# Patient Record
Sex: Female | Born: 1950 | Race: White | Hispanic: No | Marital: Married | Smoking: Never smoker
Health system: Southern US, Community
[De-identification: ages and names within clinical notes are randomized; demographics above are authoritative.]

## PROBLEM LIST (undated history)

## (undated) DIAGNOSIS — I10 Essential (primary) hypertension: Secondary | ICD-10-CM

## (undated) DIAGNOSIS — E0500 Thyrotoxicosis with diffuse goiter without thyrotoxic crisis or storm: Secondary | ICD-10-CM

## (undated) DIAGNOSIS — G43909 Migraine, unspecified, not intractable, without status migrainosus: Secondary | ICD-10-CM

## (undated) DIAGNOSIS — Z1211 Encounter for screening for malignant neoplasm of colon: Secondary | ICD-10-CM

## (undated) DIAGNOSIS — C801 Malignant (primary) neoplasm, unspecified: Secondary | ICD-10-CM

## (undated) DIAGNOSIS — Z923 Personal history of irradiation: Secondary | ICD-10-CM

## (undated) DIAGNOSIS — C50919 Malignant neoplasm of unspecified site of unspecified female breast: Secondary | ICD-10-CM

## (undated) HISTORY — DX: Thyrotoxicosis with diffuse goiter without thyrotoxic crisis or storm: E05.00

## (undated) HISTORY — DX: Essential (primary) hypertension: I10

## (undated) HISTORY — DX: Malignant (primary) neoplasm, unspecified: C80.1

## (undated) HISTORY — PX: FOOT SURGERY: SHX648

## (undated) HISTORY — DX: Migraine, unspecified, not intractable, without status migrainosus: G43.909

## (undated) HISTORY — DX: Encounter for screening for malignant neoplasm of colon: Z12.11

---

## 2000-08-04 ENCOUNTER — Emergency Department (HOSPITAL_COMMUNITY): Admission: EM | Admit: 2000-08-04 | Discharge: 2000-08-04 | Payer: Self-pay | Admitting: Internal Medicine

## 2001-09-05 DIAGNOSIS — C801 Malignant (primary) neoplasm, unspecified: Secondary | ICD-10-CM

## 2001-09-05 DIAGNOSIS — C50919 Malignant neoplasm of unspecified site of unspecified female breast: Secondary | ICD-10-CM

## 2001-09-05 HISTORY — DX: Malignant neoplasm of unspecified site of unspecified female breast: C50.919

## 2001-09-05 HISTORY — PX: BREAST SURGERY: SHX581

## 2001-09-05 HISTORY — DX: Malignant (primary) neoplasm, unspecified: C80.1

## 2001-09-05 HISTORY — PX: BREAST LUMPECTOMY: SHX2

## 2001-09-05 HISTORY — PX: BREAST EXCISIONAL BIOPSY: SUR124

## 2002-09-05 HISTORY — PX: COLONOSCOPY: SHX174

## 2002-09-05 HISTORY — PX: ABDOMINAL HYSTERECTOMY: SHX81

## 2002-12-11 ENCOUNTER — Other Ambulatory Visit: Admission: RE | Admit: 2002-12-11 | Discharge: 2002-12-11 | Payer: Self-pay | Admitting: Obstetrics and Gynecology

## 2003-04-16 ENCOUNTER — Observation Stay (HOSPITAL_COMMUNITY): Admission: RE | Admit: 2003-04-16 | Discharge: 2003-04-17 | Payer: Self-pay | Admitting: Obstetrics and Gynecology

## 2004-06-30 ENCOUNTER — Ambulatory Visit: Payer: Self-pay | Admitting: Unknown Physician Specialty

## 2004-07-01 ENCOUNTER — Ambulatory Visit: Payer: Self-pay | Admitting: Oncology

## 2004-07-02 ENCOUNTER — Ambulatory Visit: Payer: Self-pay | Admitting: Unknown Physician Specialty

## 2004-07-06 ENCOUNTER — Ambulatory Visit: Payer: Self-pay | Admitting: Oncology

## 2004-09-05 DIAGNOSIS — I10 Essential (primary) hypertension: Secondary | ICD-10-CM

## 2004-09-05 HISTORY — DX: Essential (primary) hypertension: I10

## 2004-09-13 ENCOUNTER — Ambulatory Visit: Payer: Self-pay | Admitting: General Surgery

## 2004-12-30 ENCOUNTER — Ambulatory Visit: Payer: Self-pay | Admitting: Oncology

## 2005-01-03 ENCOUNTER — Ambulatory Visit: Payer: Self-pay | Admitting: Oncology

## 2005-04-11 ENCOUNTER — Ambulatory Visit: Payer: Self-pay | Admitting: General Surgery

## 2005-11-16 ENCOUNTER — Ambulatory Visit: Payer: Self-pay | Admitting: Oncology

## 2005-12-04 ENCOUNTER — Ambulatory Visit: Payer: Self-pay | Admitting: Oncology

## 2006-04-10 ENCOUNTER — Ambulatory Visit: Payer: Self-pay | Admitting: General Surgery

## 2006-06-28 ENCOUNTER — Ambulatory Visit: Payer: Self-pay | Admitting: Oncology

## 2006-07-06 ENCOUNTER — Ambulatory Visit: Payer: Self-pay | Admitting: Oncology

## 2007-01-04 ENCOUNTER — Ambulatory Visit: Payer: Self-pay | Admitting: Oncology

## 2007-01-11 ENCOUNTER — Ambulatory Visit: Payer: Self-pay | Admitting: Oncology

## 2007-02-04 ENCOUNTER — Ambulatory Visit: Payer: Self-pay | Admitting: Oncology

## 2007-04-16 ENCOUNTER — Ambulatory Visit: Payer: Self-pay | Admitting: General Surgery

## 2007-08-06 ENCOUNTER — Ambulatory Visit: Payer: Self-pay | Admitting: Oncology

## 2007-08-10 ENCOUNTER — Ambulatory Visit: Payer: Self-pay | Admitting: Oncology

## 2007-09-06 ENCOUNTER — Ambulatory Visit: Payer: Self-pay | Admitting: Oncology

## 2007-09-06 HISTORY — PX: CYST EXCISION: SHX5701

## 2007-10-08 ENCOUNTER — Ambulatory Visit: Payer: Self-pay | Admitting: General Surgery

## 2008-02-04 ENCOUNTER — Ambulatory Visit: Payer: Self-pay | Admitting: Oncology

## 2008-02-12 ENCOUNTER — Ambulatory Visit: Payer: Self-pay | Admitting: Oncology

## 2008-03-05 ENCOUNTER — Ambulatory Visit: Payer: Self-pay | Admitting: Oncology

## 2008-04-05 ENCOUNTER — Ambulatory Visit: Payer: Self-pay | Admitting: Oncology

## 2008-04-07 ENCOUNTER — Ambulatory Visit: Payer: Self-pay | Admitting: General Surgery

## 2008-04-09 ENCOUNTER — Encounter: Payer: Self-pay | Admitting: Otolaryngology

## 2008-05-06 ENCOUNTER — Encounter: Payer: Self-pay | Admitting: Otolaryngology

## 2009-02-03 ENCOUNTER — Ambulatory Visit: Payer: Self-pay | Admitting: Oncology

## 2009-02-26 ENCOUNTER — Ambulatory Visit: Payer: Self-pay | Admitting: Oncology

## 2009-03-05 ENCOUNTER — Ambulatory Visit: Payer: Self-pay | Admitting: Oncology

## 2009-04-15 ENCOUNTER — Ambulatory Visit: Payer: Self-pay | Admitting: General Surgery

## 2010-03-05 ENCOUNTER — Ambulatory Visit: Payer: Self-pay | Admitting: Oncology

## 2010-03-12 ENCOUNTER — Ambulatory Visit: Payer: Self-pay | Admitting: Oncology

## 2010-04-05 ENCOUNTER — Ambulatory Visit: Payer: Self-pay | Admitting: Oncology

## 2010-05-06 ENCOUNTER — Ambulatory Visit: Payer: Self-pay | Admitting: Oncology

## 2011-03-29 ENCOUNTER — Ambulatory Visit: Payer: Self-pay | Admitting: Oncology

## 2011-04-06 ENCOUNTER — Ambulatory Visit: Payer: Self-pay | Admitting: Oncology

## 2011-05-07 ENCOUNTER — Ambulatory Visit: Payer: Self-pay | Admitting: Oncology

## 2011-09-06 HISTORY — PX: REPLACEMENT TOTAL KNEE: SUR1224

## 2011-10-24 ENCOUNTER — Ambulatory Visit: Payer: Self-pay | Admitting: General Practice

## 2011-10-24 LAB — BASIC METABOLIC PANEL
Anion Gap: 10 (ref 7–16)
Calcium, Total: 8.9 mg/dL (ref 8.5–10.1)
Chloride: 105 mmol/L (ref 98–107)
Co2: 29 mmol/L (ref 21–32)
EGFR (African American): >60
EGFR (Non-African Amer.): >60
Potassium: 3.8 mmol/L (ref 3.5–5.1)
Sodium: 144 mmol/L (ref 136–145)

## 2011-10-24 LAB — PROTIME-INR
INR: 0.9
Prothrombin Time: 12.4 secs (ref 11.5–14.7)

## 2011-10-24 LAB — URINALYSIS, COMPLETE
Bacteria: NONE SEEN
Bilirubin,UR: NEGATIVE
Glucose,UR: NEGATIVE mg/dL (ref 0–75)
Ketone: NEGATIVE
Ph: 6 (ref 4.5–8.0)
RBC,UR: 1 /HPF (ref 0–5)
Squamous Epithelial: 3
WBC UR: 1 /HPF (ref 0–5)

## 2011-10-24 LAB — CBC
MCHC: 32.8 g/dL (ref 32.0–36.0)
Platelet: 257 10*3/uL (ref 150–440)
RDW: 12.8 % (ref 11.5–14.5)
WBC: 6 10*3/uL (ref 3.6–11.0)

## 2011-10-24 LAB — SEDIMENTATION RATE: Erythrocyte Sed Rate: 6 mm/hr (ref 0–30)

## 2011-10-24 LAB — APTT: Activated PTT: 27 secs (ref 23.6–35.9)

## 2011-11-07 ENCOUNTER — Inpatient Hospital Stay: Payer: Self-pay | Admitting: General Practice

## 2011-11-08 LAB — BASIC METABOLIC PANEL
Calcium, Total: 8.1 mg/dL — ABNORMAL LOW (ref 8.5–10.1)
Co2: 24 mmol/L (ref 21–32)
Creatinine: 0.72 mg/dL (ref 0.60–1.30)
Potassium: 3.6 mmol/L (ref 3.5–5.1)
Sodium: 139 mmol/L (ref 136–145)

## 2011-11-08 LAB — HEMOGLOBIN: HGB: 10.9 g/dL — ABNORMAL LOW (ref 12.0–16.0)

## 2011-11-08 LAB — PLATELET COUNT: Platelet: 162 10*3/uL (ref 150–440)

## 2011-11-09 LAB — PLATELET COUNT: Platelet: 164 10*3/uL (ref 150–440)

## 2011-11-09 LAB — BASIC METABOLIC PANEL
Anion Gap: 12 (ref 7–16)
BUN: 8 mg/dL (ref 7–18)
Calcium, Total: 8.1 mg/dL — ABNORMAL LOW (ref 8.5–10.1)
Co2: 25 mmol/L (ref 21–32)
Creatinine: 0.7 mg/dL (ref 0.60–1.30)
EGFR (African American): >60
Osmolality: 288 (ref 275–301)

## 2011-11-09 LAB — HEMOGLOBIN: HGB: 10.9 g/dL — ABNORMAL LOW (ref 12.0–16.0)

## 2012-03-28 ENCOUNTER — Ambulatory Visit: Payer: Self-pay | Admitting: Oncology

## 2012-04-10 ENCOUNTER — Ambulatory Visit: Payer: Self-pay | Admitting: Oncology

## 2012-04-10 LAB — COMPREHENSIVE METABOLIC PANEL
Albumin: 3.9 g/dL (ref 3.4–5.0)
Alkaline Phosphatase: 77 U/L (ref 50–136)
Bilirubin,Total: 0.8 mg/dL (ref 0.2–1.0)
Creatinine: 0.99 mg/dL (ref 0.60–1.30)
Glucose: 94 mg/dL (ref 65–99)
Osmolality: 278 (ref 275–301)
SGPT (ALT): 27 U/L (ref 12–78)
Sodium: 138 mmol/L (ref 136–145)

## 2012-04-10 LAB — CBC CANCER CENTER
Basophil #: 0 x10 3/mm (ref 0.0–0.1)
Basophil %: 0.7 %
Lymphocyte %: 39.4 %
MCV: 93 fL (ref 80–100)
Monocyte %: 5.9 %
Neutrophil #: 3.1 x10 3/mm (ref 1.4–6.5)
Platelet: 227 x10 3/mm (ref 150–440)
RDW: 14.7 % — ABNORMAL HIGH (ref 11.5–14.5)
WBC: 6.1 x10 3/mm (ref 3.6–11.0)

## 2012-04-11 LAB — CANCER ANTIGEN 27.29: CA 27.29: 18.6 U/mL (ref 0.0–38.6)

## 2012-05-06 ENCOUNTER — Ambulatory Visit: Payer: Self-pay | Admitting: Oncology

## 2013-04-15 ENCOUNTER — Other Ambulatory Visit: Payer: Self-pay | Admitting: *Deleted

## 2013-04-15 ENCOUNTER — Ambulatory Visit (INDEPENDENT_AMBULATORY_CARE_PROVIDER_SITE_OTHER): Payer: BC Managed Care – PPO | Admitting: General Surgery

## 2013-04-15 ENCOUNTER — Encounter: Payer: Self-pay | Admitting: General Surgery

## 2013-04-15 VITALS — BP 134/82 | HR 62 | Resp 14 | Ht 63.0 in | Wt 160.0 lb

## 2013-04-15 DIAGNOSIS — Z1211 Encounter for screening for malignant neoplasm of colon: Secondary | ICD-10-CM

## 2013-04-15 DIAGNOSIS — Z853 Personal history of malignant neoplasm of breast: Secondary | ICD-10-CM

## 2013-04-15 HISTORY — DX: Encounter for screening for malignant neoplasm of colon: Z12.11

## 2013-04-15 MED ORDER — POLYETHYLENE GLYCOL 3350 17 GM/SCOOP PO POWD: ORAL | Status: DC

## 2013-04-15 NOTE — Progress Notes (Signed)
Patient to have a bilateral diagnostic mammogram at [facility] on 04-16-13 at 9 am. She is aware of date, time, and instructions.

## 2013-04-15 NOTE — Progress Notes (Signed)
Patient ID: Diane Shea, female   DOB: 07-22-1951, 62 y.o.   MRN: [id number]  Chief Complaint  Patient presents with  . Other    colonoscopy    HPI Diane Shea is a 62 y.o. female here today for an evalation of an colonoscopy screening.No GI problems.   HPI  Past Medical History  Diagnosis Date  . Cancer 2003    Breast  . Graves disease   . Migraines   . Hypertension 2006  . Encounter for screening colonoscopy 04/15/2013    Past Surgical History  Procedure Laterality Date  . 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 Breast lumpectomy Left 2003  . Breast biopsy Left 2003    sentinel node  . Breast surgery Left 2003    mammosite placement  . Colonoscopy  2004  . Abdominal hysterectomy  2004  . Foot surgery    . Replacement total knee Left 2013    No family history on file.  Social History History  Substance Use Topics  . Smoking status: Never Smoker   . Smokeless tobacco: Never Used  . Alcohol Use: No    Not on File  Current Outpatient Prescriptions  Medication Sig Dispense Refill  . aspirin 81 MG tablet Take 81 mg by mouth daily.      . calcium carbonate (OS-CAL) 600 MG TABS tablet Take 600 mg by mouth 2 (two) times daily with a meal.      . cholecalciferol (VITAMIN D) 1000 UNITS tablet Take 1,000 Units by mouth daily.      . fish oil-omega-3 fatty acids 1000 MG capsule Take 2 g by mouth daily.      Marland Kitchen levothyroxine (SYNTHROID, LEVOTHROID) 137 MCG tablet Take 1 tablet by mouth daily.      . Multiple Vitamins-Minerals (MULTIVITAMIN WITH MINERALS) tablet Take 1 tablet by mouth daily.      . propranolol ER (INDERAL LA) 60 MG 24 hr capsule Take 1 capsule by mouth daily.      . SUMAtriptan (IMITREX) 100 MG tablet Take 1 tablet by mouth daily.      . polyethylene glycol powder (GLYCOLAX/MIRALAX) powder 255 grams one bottle for colonoscopy prep  255 g  0   No current facility-administered medications for this visit.    Review of Systems Review of Systems   Constitutional: Negative.   Respiratory: Negative.   Cardiovascular: Negative.   Gastrointestinal: Negative.     Blood pressure 134/82, pulse 62, resp. rate 14, height 5\' 3"  (1.6 m), weight 160 lb (72.576 kg).  Physical Exam Physical Exam  Constitutional: She is oriented to person, place, and time. She appears well-developed and well-nourished.  Cardiovascular: Normal rate, regular rhythm and normal heart sounds.   Pulmonary/Chest: Breath sounds normal. Right breast exhibits no inverted nipple, no mass, no nipple discharge, no skin change and no tenderness. Left breast exhibits no inverted nipple, no mass, no nipple discharge, no skin change and no tenderness.    Left breast  3 cm 12 o'clock thickening  Lymphadenopathy:    She has no cervical adenopathy.    She has no axillary adenopathy.  Neurological: She is alert and oriented to person, place, and time.  Skin: Skin is warm and dry.    Data Reviewed  The patient's 2004 examination was notable for one 5 mm polyp in the transverse colon.   Assessment    Candidate for followup colonoscopy.     Plan    The patient has  undergone total joint replacement since her previous study. We'll contact her to confirm no medical allergies. If not, she will be given amoxicillin 2 g p.o. One hour prior to procedure to minimize the risk of joint space infection.     Patient to have a bilateral diagnostic mammogram at [facility] on 04-16-13 at 9 am.  This patient has been scheduled for a colonoscopy on 05-28-13 at [facility]. Patient has been asked to discontinue fish oil one week prior to procedure.   Diane Shea 04/15/2013, 8:46 PM

## 2013-04-15 NOTE — Patient Instructions (Addendum)
Colonoscopy A colonoscopy is an exam to evaluate your entire colon. In this exam, your colon is cleansed. A long fiberoptic tube is inserted through your rectum and into your colon. The fiberoptic scope (endoscope) is a long bundle of enclosed and very flexible fibers. These fibers transmit light to the area examined and send images from that area to your caregiver. Discomfort is usually minimal. You may be given a drug to help you sleep (sedative) during or prior to the procedure. This exam helps to detect lumps (tumors), polyps, inflammation, and areas of bleeding. Your caregiver may also take a small piece of tissue (biopsy) that will be examined under a microscope. LET YOUR CAREGIVER KNOW ABOUT:   Allergies to food or medicine.  Medicines taken, including vitamins, herbs, eyedrops, over-the-counter medicines, and creams.  Use of steroids (by mouth or creams).  Previous problems with anesthetics or numbing medicines.  History of bleeding problems or blood clots.  Previous surgery.  Other health problems, including diabetes and kidney problems.  Possibility of pregnancy, if this applies. BEFORE THE PROCEDURE   A clear liquid diet may be required for 2 days before the exam.  Ask your caregiver about changing or stopping your regular medications.  Liquid injections (enemas) or laxatives may be required.  A large amount of electrolyte solution may be given to you to drink over a short period of time. This solution is used to clean out your colon.  You should be present 60 minutes prior to your procedure or as directed by your caregiver. AFTER THE PROCEDURE   If you received a sedative or pain relieving medication, you will need to arrange for someone to drive you home.  Occasionally, there is a little blood passed with the first bowel movement. Do not be concerned. FINDING OUT THE RESULTS OF YOUR TEST Not all test results are available during your visit. If your test results are  not back during the visit, make an appointment with your caregiver to find out the results. Do not assume everything is normal if you have not heard from your caregiver or the medical facility. It is important for you to follow up on all of your test results. HOME CARE INSTRUCTIONS   It is not unusual to pass moderate amounts of gas and experience mild abdominal cramping following the procedure. This is due to air being used to inflate your colon during the exam. Walking or a warm pack on your belly (abdomen) may help.  You may resume all normal meals and activities after sedatives and medicines have worn off.  Only take over-the-counter or prescription medicines for pain, discomfort, or fever as directed by your caregiver. Do not use aspirin or blood thinners if a biopsy was taken. Consult your caregiver for medicine usage if biopsies were taken. SEEK IMMEDIATE MEDICAL CARE IF:   You have a fever.  You pass large blood clots or fill a toilet with blood following the procedure. This may also occur 10 to 14 days following the procedure. This is more likely if a biopsy was taken.  You develop abdominal pain that keeps getting worse and cannot be relieved with medicine. Document Released: 08/19/2000 Document Revised: 11/14/2011 Document Reviewed: 04/03/2008 [facility] Patient Information 2014 [place], [place].  Patient to have a bilateral diagnostic mammogram at [facility] on 04-16-13 at 9 am.  This patient has been scheduled for a colonoscopy on 05-28-13 at [facility]. Patient has been asked to discontinue fish oil one week prior to procedure.

## 2013-04-16 ENCOUNTER — Ambulatory Visit: Payer: Self-pay | Admitting: General Surgery

## 2013-04-16 ENCOUNTER — Telehealth: Payer: Self-pay | Admitting: *Deleted

## 2013-04-16 NOTE — Telephone Encounter (Signed)
Message copied by Levada Schilling on Tue Apr 16, 2013  9:13 AM ------      Message from: [place], [place] W      Created: Mon Apr 15, 2013  8:50 PM       Please contact the patient and clarify whether she has any medical allergies. Thank you ------

## 2013-05-19 ENCOUNTER — Other Ambulatory Visit: Payer: Self-pay | Admitting: General Surgery

## 2013-05-19 DIAGNOSIS — Z853 Personal history of malignant neoplasm of breast: Secondary | ICD-10-CM

## 2013-05-20 ENCOUNTER — Telehealth: Payer: Self-pay | Admitting: *Deleted

## 2013-05-20 NOTE — Telephone Encounter (Signed)
Patient reports no medication changes since last office visit. She states she has already pre-registered and has Miralax prescription. We will proceed with colonoscopy that is scheduled for 05-28-13 at [facility].

## 2013-05-20 NOTE — Telephone Encounter (Signed)
Called the patient she states she is not  allergy to any medication or does not have any allergies . No new medication changes. I called in Amoxicillin  500 mg #4 to her pharmacy.

## 2013-05-20 NOTE — Telephone Encounter (Signed)
Message copied by Levada Schilling on Mon May 20, 2013  9:36 AM ------      Message from: [place], [place] W      Created: Sun May 19, 2013 12:20 PM       In August there was a request to confirm whether the patient had any allergies. I do not see that this was completed. Please confirm whether the patient does or does not have any allergies. If no allergies, a prescription for amoxicillin, 500 mg capsules, #4 to be taken one hour prior to colonoscopy should be sent for pharmacy. If she does have a penicillin allergy I need to be notified. ------

## 2013-05-28 ENCOUNTER — Ambulatory Visit: Payer: Self-pay | Admitting: General Surgery

## 2013-05-28 DIAGNOSIS — Z1211 Encounter for screening for malignant neoplasm of colon: Secondary | ICD-10-CM

## 2013-05-29 ENCOUNTER — Encounter: Payer: Self-pay | Admitting: General Surgery

## 2014-06-17 ENCOUNTER — Ambulatory Visit: Payer: Self-pay | Admitting: Internal Medicine

## 2014-07-07 ENCOUNTER — Encounter: Payer: Self-pay | Admitting: General Surgery

## 2014-09-09 ENCOUNTER — Ambulatory Visit: Payer: Self-pay | Admitting: General Practice

## 2014-09-09 DIAGNOSIS — I10 Essential (primary) hypertension: Secondary | ICD-10-CM

## 2014-09-09 LAB — BASIC METABOLIC PANEL
Anion Gap: 7 (ref 7–16)
BUN: 12 mg/dL (ref 7–18)
CALCIUM: 9 mg/dL (ref 8.5–10.1)
CREATININE: 0.69 mg/dL (ref 0.60–1.30)
Chloride: 104 mmol/L (ref 98–107)
Co2: 28 mmol/L (ref 21–32)
EGFR (African American): >60
EGFR (Non-African Amer.): >60
Glucose: 77 mg/dL (ref 65–99)
Osmolality: 276 (ref 275–301)
POTASSIUM: 4 mmol/L (ref 3.5–5.1)
SODIUM: 139 mmol/L (ref 136–145)

## 2014-09-09 LAB — SEDIMENTATION RATE: ERYTHROCYTE SED RATE: 6 mm/h (ref 0–30)

## 2014-09-09 LAB — URINALYSIS, COMPLETE
BACTERIA: NONE SEEN
Bilirubin,UR: NEGATIVE
Blood: NEGATIVE
GLUCOSE, UR: NEGATIVE mg/dL (ref 0–75)
Ketone: NEGATIVE
LEUKOCYTE ESTERASE: NEGATIVE
NITRITE: NEGATIVE
Ph: 6 (ref 4.5–8.0)
Protein: NEGATIVE
Specific Gravity: 1.014 (ref 1.003–1.030)
Squamous Epithelial: <1

## 2014-09-09 LAB — APTT: ACTIVATED PTT: 28.6 s (ref 23.6–35.9)

## 2014-09-09 LAB — CBC
HCT: 43.8 % (ref 35.0–47.0)
HGB: 14 g/dL (ref 12.0–16.0)
MCH: 31.3 pg (ref 26.0–34.0)
MCHC: 31.9 g/dL — AB (ref 32.0–36.0)
MCV: 98 fL (ref 80–100)
Platelet: 271 10*3/uL (ref 150–440)
RBC: 4.46 10*6/uL (ref 3.80–5.20)
RDW: 14.9 % — ABNORMAL HIGH (ref 11.5–14.5)
WBC: 4.9 10*3/uL (ref 3.6–11.0)

## 2014-09-09 LAB — PROTIME-INR
INR: 0.9
Prothrombin Time: 12.5 secs (ref 11.5–14.7)

## 2014-09-09 LAB — MRSA PCR SCREENING

## 2014-09-11 LAB — URINE CULTURE

## 2014-09-22 ENCOUNTER — Inpatient Hospital Stay: Payer: Self-pay | Admitting: General Practice

## 2014-09-23 LAB — BASIC METABOLIC PANEL
ANION GAP: 7 (ref 7–16)
BUN: 7 mg/dL (ref 7–18)
CHLORIDE: 108 mmol/L — AB (ref 98–107)
CO2: 26 mmol/L (ref 21–32)
CREATININE: 0.69 mg/dL (ref 0.60–1.30)
Calcium, Total: 7.8 mg/dL — ABNORMAL LOW (ref 8.5–10.1)
EGFR (African American): >60
EGFR (Non-African Amer.): >60
Glucose: 98 mg/dL (ref 65–99)
Osmolality: 279 (ref 275–301)
Potassium: 4 mmol/L (ref 3.5–5.1)
SODIUM: 141 mmol/L (ref 136–145)

## 2014-09-23 LAB — HEMOGLOBIN: HGB: 11.9 g/dL — AB (ref 12.0–16.0)

## 2014-09-23 LAB — PLATELET COUNT: Platelet: 191 10*3/uL (ref 150–440)

## 2014-09-24 LAB — BASIC METABOLIC PANEL
Anion Gap: 6 — ABNORMAL LOW (ref 7–16)
BUN: 8 mg/dL (ref 7–18)
CREATININE: 0.73 mg/dL (ref 0.60–1.30)
Calcium, Total: 7.9 mg/dL — ABNORMAL LOW (ref 8.5–10.1)
Chloride: 103 mmol/L (ref 98–107)
Co2: 27 mmol/L (ref 21–32)
EGFR (Non-African Amer.): >60
Glucose: 114 mg/dL — ABNORMAL HIGH (ref 65–99)
Osmolality: 271 (ref 275–301)
Potassium: 3.6 mmol/L (ref 3.5–5.1)
Sodium: 136 mmol/L (ref 136–145)

## 2014-09-24 LAB — PLATELET COUNT: PLATELETS: 172 10*3/uL (ref 150–440)

## 2014-09-24 LAB — HEMOGLOBIN: HGB: 11.6 g/dL — ABNORMAL LOW (ref 12.0–16.0)

## 2014-12-28 NOTE — Discharge Summary (Signed)
PATIENT NAME:  Diane Shea, APEL MR#:  [id number] DATE OF BIRTH:  [birth year]  DATE OF ADMISSION:  11/07/2011 DATE OF DISCHARGE:  11/10/2011   ADMITTING DIAGNOSIS: Degenerative arthrosis of left knee.   DISCHARGE DIAGNOSIS: Degenerative arthrosis of left knee.   HISTORY: The patient is a pleasant 64 year old female who has been followed at [facility] for progression of bilateral knee pain with the left being more symptomatic than the right. She has continued to report severe medial left knee pain with weightbearing activities. The patient on occasion had noted some swelling of the knee but denied any gross locking or giving way of the knee. She had also noted some difficulty with getting up and down. At the time of surgery, she was using a cane occasionally for ambulation. The patient had not seen any significant improvement in her condition despite a series of Synvisc injections and the use of anti-inflammatory medication. Her pain had progressed to the point that it was significantly interfering with her activities of daily living. X-rays taken in the clinic showed narrowing of the medial cartilage space with associated varus alignment. Osteophyte as well as subchondral sclerosis was noted. After discussion of the risks and benefits of surgical intervention, the patient expressed her understanding of the risks and benefits and agreed for plans for surgical intervention.   PROCEDURE: Left total knee arthroplasty using computer-assisted navigation.   ANESTHESIA: Femoral nerve block, spinal, and general.   SOFT TISSUE RELEASE: Anterior cruciate ligament, posterior cruciate ligament, deep medial collateral ligaments as well as the patellofemoral ligament.   IMPLANTS UTILIZED: DePuy PFC Sigma size 4 narrow posterior stabilized femoral component (cemented), size 3 MBT tibial component (cemented), 35 mm three pegged oval dome patella (cemented), and a 10 mm stabilized rotating platform polyethylene  insert.   HOSPITAL COURSE: The patient tolerated the procedure very well. She had no complications. She was then taken to PAC-U where she was stabilized and then transferred to the orthopedic floor. The patient began receiving anticoagulation therapy of Lovenox 30 mg sub-Q q.12 hours per anesthesia and pharmacy protocol. The patient was fitted with TED stockings bilaterally. These were allowed to be removed one hour per eight hour shift. The left one was applied on day two following removal of the Hemovac and dressing change. The patient was also fitted with the AV-I compression foot pumps set at 80 mmHg. Her calves have been nontender, free of any evidence of any deep venous thromboses. Negative Homans sign. Heels were elevated off the bed using rolled towels. There was no tissue breakdown. No tenderness noted to palpation.   The patient has denied any chest pain or any shortness of breath. Vital signs have been stable. She has been afebrile. Hemodynamically the patient was stable. No transfusions were given other than the Autovac transfusions for six hours postoperatively.   The patient began receiving physical therapy on day one. Upon being discharged, she was ambulating greater than 215 feet. She was able go up and down four sets of steps. She was independent with bed-to-chair transfers. Occupational therapy was also initiated on day one for activities of daily living and assistive devices.   The patient's Hemovac, IV, and Foley were all discontinued on day two along with a dressing change. The wound was free of any drainage or signs of infection. Polar Care was reapplied to the surgical leg maintaining a temperature of 40 to 50 degrees Fahrenheit.   DISPOSITION: The patient is being discharged to home in improved stable condition.  DISCHARGE  INSTRUCTIONS:  1. She may weight bear as tolerated. Continue using a walker until cleared by physical therapy to go to a quad cane. She will receive home  health PT. 2. Continue TED stockings. These are to be worn bilaterally. She is to wear these during the day but may remove them at night.  3. Continue Polar Care maintaining a temperature of 40 to 50 degrees Fahrenheit.  4. She has a follow-up appointment in two weeks in the clinic, sooner if any temperatures of 101.5 or greater or excessive bleeding.  5. She is placed on a regular diet.  6. She is to resume her regular medication that she was on prior to admission. She was given a prescription for oxycodone 1 to 2 tablets q.4 to 6 hours p.r.n. for pain as well as tramadol 1 to 2 tablets q.4 to 6 hours p.r.n. for pain. A prescription was given for Lovenox 40 mg sub-Q daily for 14 days, then discontinue and begin taking one 81 mg enteric-coated aspirin per day.   PAST MEDICAL HISTORY:  1. Chickenpox. 2. Breast cancer in 2003. 3. Graves' disease in 2004.  4. Migraines. 5. Borderline hypertension.   ____________________________ Vance Peper, PA [id number] D: 11/10/2011 07:47:44 ET T: 11/10/2011 11:22:45 ET JOB#: [id number]  cc: Vance Peper, PA, <Dictator> Sheritta Deeg PA ELECTRONICALLY SIGNED 11/13/2011 19:57

## 2014-12-28 NOTE — Op Note (Signed)
PATIENT NAME:  Diane Shea, Diane Shea MR#:  [id number] DATE OF BIRTH:  11-Feb-1951  DATE OF PROCEDURE:  11/07/2011  PREOPERATIVE DIAGNOSIS: Degenerative arthrosis left knee.   POSTOPERATIVE DIAGNOSIS: Degenerative arthrosis left knee.   PROCEDURE PERFORMED: Left total knee arthroplasty using computer-assisted navigation.   SURGEON: Laurice Record. Holley Bouche., MD  ASSISTANT: Vance Peper, PA-C (required to maintain retraction throughout the procedure)   ANESTHESIA: Femoral nerve block, spinal, and general.   ESTIMATED BLOOD LOSS: 100 mL.   FLUIDS REPLACED: 1650 mL.   TOURNIQUET TIME: 83 minutes.   DRAINS: Two medium drains to reinfusion system.   SOFT TISSUE RELEASES: Anterior cruciate ligament, posterior cruciate ligament, deep medial collateral ligament, patellofemoral ligament.   IMPLANTS UTILIZED: DePuy PFC Sigma size 4N (narrow) posterior stabilized femoral component (cemented), size 3 MBT tibial component (cemented), 35 mm three peg oval dome patella (cemented), and a 10 mm stabilized rotating platform polyethylene insert.   INDICATIONS FOR SURGERY: The patient is a 64 year old female who has been seen for complaints of progressive left knee pain. X-rays demonstrated severe degenerative changes with relative varus deformity. She did not see any significant improvement despite conservative nonsurgical intervention. After discussion of the risks and benefits of surgical intervention, the patient expressed her understanding of the risks and benefits and agreed with plans for surgical intervention.   PROCEDURE IN DETAIL: Patient was brought into the Operating Room and, after adequate femoral nerve block, spinal, and general anesthesia was achieved, a tourniquet was placed on the patient's upper left thigh. Patient's left knee and leg were cleaned and prepped with alcohol and DuraPrep, draped in the usual sterile fashion. A "timeout" was performed as per usual protocol. The left lower extremity was  exsanguinated using an Esmarch, and the tourniquet was inflated to 300 mmHg. Anterior longitudinal incision was made followed by a standard mid vastus approach. A moderate effusion was evacuated. Deep fibers of the medial collateral ligament were elevated in subperiosteal fashion off of the medial flare of the tibia so as to maintain a continuous soft tissue sleeve. Patella was subluxed laterally and the patellofemoral ligament was incised. Inspection of the knee demonstrated severe degenerative changes in tricompartmental fashion with evidence of eburnated bone to the medial compartment. Prominent osteophytes were debrided using rongeur. Anterior and posterior cruciate ligaments were excised. Two 4.0 mm Schanz pins were inserted into the femur and into the tibia for attachment of the array of spheres used for computer-assisted navigation. Hip center was identified using circumduction technique. Distal landmarks were mapped using computer. Distal femur and proximal tibia were mapped using computer. Distal femoral cutting guide was positioned using computer-assisted navigation so as to achieve 5 degrees distal valgus cut. Cut was performed and verified using computer. Distal femur was sized and it was felt that a size 4N (narrow) femoral component was appropriate. A size 4 cutting guide was positioned using computer-assisted navigation and the anterior cut was performed and verified using computer followed by completion of the posterior and chamfer cuts. Femoral cutting guide for central box was then positioned and central box cut was performed.   Attention was then directed to the proximal tibia. The medial and lateral menisci were excised. The extramedullary tibial cutting guide was positioned using computer-assisted navigation so as to achieve 0 degrees varus valgus alignment and 0 degrees posterior slope. Cut was performed and verified using the computer. Proximal tibia was then sized and it was felt that a  size 3 tibial tray was appropriate. Tibial and femoral trials were  inserted followed by insertion of a 10 mm polyethylene trial. Excellent mediolateral soft tissue balancing was appreciated both in full extension and in 90 degrees of flexion. Finally, patella was cut and prepared so as to accommodate a 35 mm three peg oval dome patella. Patellar trial was placed and the knee was placed through a range of motion with excellent patellar tracking appreciated.   Femoral trial was removed. Central post hole for the tibial tray was reamed followed by insertion of a keel punch. Tibial trials were then removed. Cut surfaces of bone were irrigated with copious amounts of normal saline with antibiotic solution using pulsatile lavage and then suctioned dry. Polymethyl methacrylate cement was prepared using a vacuum mixer. Cement was applied to the cut surface of proximal tibia as well as along the undersurface of a size 3 MBT tibial component. Tibial component was positioned and impacted into place. Excess cement was removed using freer elevators. Cement was then applied to cut surface of the femur as well as along the posterior flanges of size 4N (narrow) posterior stabilized femoral component. Femoral component was positioned and impacted in place. Excess cement was removed using freer elevators. A 10 mm polyethylene trial was inserted and the knee was brought into full extension with steady axial compression applied. Finally, cement was applied to the backside of a 35 mm three peg oval dome patella and patellar component was positioned and patellar clamp applied. Excess cement was removed using freer elevators.   After adequate curing of cement, tourniquet was deflated after total tourniquet time of 83 minutes. Hemostasis was achieved using electrocautery. The knee was irrigated with copious amounts of normal saline with antibiotic solution using pulsatile lavage and then suctioned dry. The knee was inspected for any  residual cement debris. 30 mL of 0.25% Marcaine with epinephrine was injected along the posterior capsule. A 10 mm stabilized rotating platform polyethylene insert was inserted and the knee was placed through a range of motion. Excellent patellar tracking was appreciated and excellent mediolateral soft tissue balancing was appreciated both in full extension and in 90 degrees of flexion.   Two medium drains were placed in the wound bed and brought out through a separate stab incision to be attached to reinfusion system. Medial parapatellar portion of the incision was reapproximated using interrupted sutures of #1 Vicryl. Subcutaneous tissue was approximated in layers using first #0 Vicryl followed by 2-0 Vicryl. Skin was closed with skin staples. Sterile dressing was applied.   Patient tolerated procedure well. She was transported to the recovery room in stable condition.    ____________________________ Laurice Record. Holley Bouche., MD jph:cms D: 11/07/2011 18:12:01 ET T: 11/08/2011 09:22:00 ET JOB#: [id number]  cc: Jeneen Rinks P. Holley Bouche., MD, <Dictator>  JAMES P Holley Bouche MD ELECTRONICALLY SIGNED 11/13/2011 20:39

## 2015-01-04 NOTE — Op Note (Signed)
PATIENT NAME:  Diane Shea, Diane Shea MR#:  [id number] DATE OF BIRTH:  May 02, 1951  DATE OF PROCEDURE:  09/22/2014  PREOPERATIVE DIAGNOSIS:  Degenerative arthrosis of the right knee (primary).   POSTOPERATIVE DIAGNOSIS:  Degenerative arthrosis of the right knee (primary).   PROCEDURE PERFORMED:  Right total knee arthroplasty using computer-assisted navigation.   SURGEON:  Laurice Record. Holley Bouche., MD   ASSISTANT:  Vance Peper, PA (required to maintain retraction throughout the procedure)   ANESTHESIA:  Spinal.   ESTIMATED BLOOD LOSS:  50 mL.   FLUIDS REPLACED:  1800 mL of crystalloid.   TOURNIQUET TIME:  82 minutes.   DRAINS:  Two medium drains to reinfusion system.   SOFT TISSUE RELEASES:  Anterior cruciate ligament, posterior cruciate ligament, deep medial collateral ligament, and patellofemoral ligament.   IMPLANTS UTILIZED:  DePuy PFC Sigma size 4N (narrow) posterior stabilized femoral component (cemented), a size 3 MBT tibial component (cemented), a 35 mm 3-peg oval dome patella (cemented), and a 10 mm stabilized rotating platform polyethylene insert.   INDICATIONS FOR SURGERY:  The patient is a 64 year old female who has been seen for complaints of progressive right knee pain. X-rays demonstrated severe degenerative changes in tricompartmental fashion with relative varus deformity. After discussion of the risks and benefits of surgical intervention, the patient expressed understanding of the risks and benefits and agreed with plans for surgical intervention.   PROCEDURE IN DETAIL:  The patient was brought in to the operating room, and after adequate spinal anesthesia was achieved, a tourniquet was placed on the patient's upper right thigh. The patient's right knee and leg were cleaned and prepped with alcohol and DuraPrep and draped in the usual sterile fashion. A "timeout" was performed as per usual protocol. The right lower extremity was exsanguinated using an Esmarch, and the tourniquet was  inflated to 300 mmHg. An anterior longitudinal incision was made followed by a standard mid vastus approach. A moderate effusion was evacuated. The deep fibers of the medial collateral ligament were elevated in a subperiosteal fashion off the medial flare of the tibia so as to maintain a continuous soft tissue sleeve. The patella was subluxed laterally, and the patellofemoral ligament was incised. Inspection of the knee demonstrated severe degenerative changes with full-thickness loss of articular cartilage most notably to the medial compartment. Prominent osteophytes were debrided using a rongeur. Anterior and posterior cruciate ligaments were excised. Two 4.0 mm Schanz pins were inserted into the femur and into the tibia for attachment of the ray of trackers used for computer-assisted navigation. Hip center was identified using circumduction technique. Distal landmarks were mapped using the computer. The distal femur and proximal tibia were mapped using the computer. Distal femoral cutting guide was positioned using computer-assisted navigation so as to achieve a 5-degree distal valgus cut. Cut was performed and verified using the computer. The distal femur was sized, and it was felt that a size 4N (narrow) femoral component was appropriate. The size 4 cutting guide was positioned, and anterior cut was performed and verified using the computer. This was followed by completion of the posterior and chamfer cuts. Femoral cutting guide for the central box was then positioned, and a central box cut was performed. Attention was then directed to the proximal tibia.   Medial and lateral menisci were excised. The extramedullary tibial cutting guide was positioned using computer-assisted navigation so as to achieve a 0-degree varus-valgus alignment and 0-degree posterior slope. Cut was performed and verified using the computer. The proximal tibia was sized, and  it was felt that a size 3 tibial tray was appropriate. Tibial  and femoral trials were inserted followed by insertion of a 10 mm polyethylene insert. The knee lacked full extension, and it was elected to resect an additional 2 mm of bone from the proximal tibia. The extramedullary tibial cutting guide was repositioned so as to remove an additional 2 mm of bone. This was performed and verified using the computer. Tibial and femoral trials were placed followed by insertion of a 10 mm polyethylene trial. This allowed for excellent mediolateral soft tissue balancing both in full extension and in flexion. Finally, the patella was cut and repaired so as to accommodate a 35 mm 3-peg oval dome patella. Patellar trial was placed, and the knee was placed through a range of motion with excellent patellar tracking appreciated. The femoral trial was removed. Central post hole for the tibial component was reamed followed by insertion of a keel punch. Tibial trials were then removed. The cut surfaces of bone were irrigated with copious amounts of normal saline with antibiotic solution using pulsatile lavage and then suctioned dry. Polymethyl methacrylate cement was prepared in the usual fashion using a vacuum mixer. Cement was applied to the cut surface of the proximal tibia as well as along the undersurface of a size 3 MBT tibial component. The tibial component was positioned and impacted into place. Excess cement was removed using [employer] fast streamer. Cement was then applied to the cut surface of the femur as well as on the posterior flanges of a size 4N (narrow) posterior stabilized femoral component. Femoral component was positioned and impacted into place. Excess cement was removed using [employer] fast streamer. A 10 mm polyethylene trial was inserted, and the knee was brought in full extension with steady axial compression applied. Finally, cement was applied to the backside of a 35 mm 3-peg oval dome patella, and the patellar component was positioned and patellar clamp applied. Excess cement was  removed using [employer] fast streamer.   After adequate curing of cement, the tourniquet was deflated after a total tourniquet time of 82 minutes. Hemostasis was achieved using electrocautery. The knee was irrigated with copious amounts of normal saline with antibiotic solution using pulsatile lavage and then suctioned dry. The knee was inspected for any residual cement debris. Exparel 1.3% 20 mL and 40 mL of normal saline was injected along the posterior capsule, medial and lateral gutters, and along the arthrotomy site. A 10 mm stabilized rotating platform polyethylene insert was inserted, and the knee was placed through a range of motion with excellent mediolateral soft tissue balancing appreciated and excellent patellar tracking appreciated. Two medium drains were placed and then brought out through a separate stab incision to be attached to a reinfusion system. The medial parapatellar portion of the incision was reapproximated using interrupted sutures of #1 Vicryl. The subcutaneous tissue was injected with 30 mL of 0.25% Marcaine with epinephrine. The subcutaneous tissue was then reapproximated in layers using first #0 Vicryl followed by #2-0 Vicryl. The skin was closed with skin staples. A sterile dressing was applied.   The patient tolerated the procedure well. She was transported to the recovery room in stable condition.   ____________________________ Laurice Record. Holley Bouche., MD jph:nb D: 09/22/2014 22:38:32 ET T: 09/22/2014 23:07:59 ET JOB#: [id number]  cc: Laurice Record. Holley Bouche., MD, <Dictator> JAMES P Holley Bouche MD ELECTRONICALLY SIGNED 09/28/2014 21:28

## 2015-01-04 NOTE — Discharge Summary (Signed)
PATIENT NAME:  Diane, Shea MR#:  [id number] DATE OF BIRTH:  1951/08/16  DATE OF ADMISSION:  09/22/2014 DATE OF DISCHARGE:  09/25/2014   ADMITTING DIAGNOSIS: Degenerative arthrosis of the right knee.   DISCHARGE DIAGNOSIS: Degenerative arthrosis of the right knee.   HISTORY OF PRESENT ILLNESS: The patient is a 64 year old who has been followed at [facility] for progression of right knee pain. The patient had previously undergone a left total knee arthroplasty and had done well. She states that the right knee had increase in discomfort. States that last cortisone injection she was given did help significantly; however, she continues to have problems with standing for long periods of time or walking long periods. She had localized most of the pain along the medial aspect of the knee. Her pain was aggravated with weight-bearing activities. She had noticed a progressive decrease in her range of motion of the knee. At the time of surgery, she was not using any ambulatory aid. She had reported the start of stiffness, as well as difficulty from arising from a sitting position. She had not seen any significant improvement in her condition despite weight reduction, activity modification, and intra-articular cortisone injection. The right knee pain had progressed to the point that it was significantly interfering with her activities of daily living. X-rays taken in [facility] showed narrowing of the medial cartilage space with associated varus alignment, osteophyte, as well as subchondral sclerosis were noted. After discussion of the risks and benefits of surgical intervention, the patient expressed her understanding of the risks and benefits and agreed for plans for surgical intervention.   HOSPITAL COURSE:   PROCEDURE: Right total knee arthroplasty using computer-assisted navigation.   ANESTHESIA: Spinal.   SOFT TISSUE RELEASE: Anterior cruciate ligament, posterior cruciate ligament, deep medial  collateral ligaments, as well as patellofemoral ligament.   IMPLANTS UTILIZED: DePuy PFC Sigma size 4 narrow, posterior stabilized femoral component, cemented; size 3 MBT tibial component, cemented; a 35 mm 3- pegged oval dome patella, cemented; and a 10 mm stabilized rotating platform polyethylene insert.   The patient tolerated the procedure very well. She had no complications. She was then taken to the PAC-U where she was stabilized and then transferred to the orthopedic floor. She began receiving anticoagulation therapy of Lovenox 30 mg subcutaneous q. 12 hours per anesthesia and pharmacy protocol. She was fitted with TED stockings bilaterally. These were allowed to be removed 1 hour per 8-hour shift. The right one was applied on day 2, following removal of the Hemovac and dressing change. The patient was also fitted with AVI compression foot pumps set at 80 mmHg. Her calves have been nontender. There has been no evidence of any DVTs. Negative Homans sign. Heels were elevated off the bed using rolled towels.   The patient has denied any chest pain or shortness of breath. Vital signs have been stable. She has been afebrile. Hemodynamically, she was stable. No transfusions were given, other than the Autovac transfusions.   The patient began receiving physical therapy day 1 following surgery. Upon being discharged from the hospital, was ambulating greater than 200 feet. She was able go up and down 4 steps. Was independent with bed to chair transfers. Occupational therapy was also initiated on day 1 for activities of daily living and assistive devices.   The patient's IV, Foley, and Hemovac were discontinued on day 2, along with a dressing change. The wound was free of any drainage or signs of infection. The Polar Care was reapplied  to the surgical leg, maintaining a temperature of 40 to 50 degrees Fahrenheit.   The patient did have one complications during the hospital stay. She was noted to have some  decreased sensation to the plantar surface of the ball of the foot, as well as the inability to dorsiflex all of her toes on the right side. This did improve upon being discharged, but still had some residual effect upon being discharged. The patient was also evaluated by Dr. Marcello Moores, anesthesiologist, about this, as to whether this was related to the spinal. There was no conclusion; however, this does not interfere with her motor functions of walking.   The patient is being discharged to home in improved, stable condition.   DISCHARGE INSTRUCTIONS: She is to continue weight-bearing as tolerated. Continue using a rolling walker until cleared by physical therapy to go to a quad cane. She will receive home health PT. Elevate the heels off the bed. Continue with thigh-high stockings. These may be removed at night but are to be worn during the day. Recommend that she use the Polar Care for the first 2 weeks around-the-clock as much as possible, maintaining a temperature of 40 to 50 degrees Fahrenheit. Incentive spirometer q. 1 hour while awake. Encouraged cough, deep breathing q. 2 hours while awake. She is placed on a regular diet. She has a follow-up appointment with [facility] on January 26th at 9:30, sooner if any complications. She is to call the clinic if any temperatures of 101.5 or higher. She may resume her regular medication that she was on prior to this admission. She was given a prescription for oxycodone 5-10 mg q. 4-6 hours p.r.n. for pain, tramadol 50-100 mg q. 4-6 hours p.r.n. for pain, and Lovenox 40 mg subcutaneously daily for 14 days, then discontinue and begin taking one 81 mg enteric-coated aspirin.   PAST MEDICAL HISTORY: Thyroid disease, migraines, hypertension, Graves' disease, breast cancer.    ____________________________ Vance Peper, PA [id number] D: 09/25/2014 08:16:09 ET T: 09/25/2014 11:08:13 ET JOB#: [id number]  cc: Vance Peper, PA, <Dictator> Gabriele Loveland PA ELECTRONICALLY SIGNED  09/30/2014 8:02

## 2015-04-15 ENCOUNTER — Emergency Department: Payer: Medicare Other

## 2015-04-15 ENCOUNTER — Encounter: Payer: Self-pay | Admitting: Medical Oncology

## 2015-04-15 ENCOUNTER — Inpatient Hospital Stay
Admission: EM | Admit: 2015-04-15 | Discharge: 2015-04-18 | DRG: 482 | Disposition: A | Discharge status: Home / Self Care | Payer: Medicare Other | Attending: Internal Medicine | Admitting: Internal Medicine

## 2015-04-15 DIAGNOSIS — Z853 Personal history of malignant neoplasm of breast: Secondary | ICD-10-CM | POA: Diagnosis not present

## 2015-04-15 DIAGNOSIS — Y92009 Unspecified place in unspecified non-institutional (private) residence as the place of occurrence of the external cause: Secondary | ICD-10-CM

## 2015-04-15 DIAGNOSIS — J984 Other disorders of lung: Secondary | ICD-10-CM | POA: Diagnosis present

## 2015-04-15 DIAGNOSIS — Z90710 Acquired absence of both cervix and uterus: Secondary | ICD-10-CM | POA: Diagnosis not present

## 2015-04-15 DIAGNOSIS — Z8042 Family history of malignant neoplasm of prostate: Secondary | ICD-10-CM

## 2015-04-15 DIAGNOSIS — S72009A Fracture of unspecified part of neck of unspecified femur, initial encounter for closed fracture: Secondary | ICD-10-CM | POA: Diagnosis present

## 2015-04-15 DIAGNOSIS — M8580 Other specified disorders of bone density and structure, unspecified site: Secondary | ICD-10-CM | POA: Diagnosis present

## 2015-04-15 DIAGNOSIS — S72001A Fracture of unspecified part of neck of right femur, initial encounter for closed fracture: Secondary | ICD-10-CM

## 2015-04-15 DIAGNOSIS — I10 Essential (primary) hypertension: Secondary | ICD-10-CM | POA: Diagnosis present

## 2015-04-15 DIAGNOSIS — R918 Other nonspecific abnormal finding of lung field: Secondary | ICD-10-CM

## 2015-04-15 DIAGNOSIS — G43909 Migraine, unspecified, not intractable, without status migrainosus: Secondary | ICD-10-CM | POA: Diagnosis present

## 2015-04-15 DIAGNOSIS — Z8249 Family history of ischemic heart disease and other diseases of the circulatory system: Secondary | ICD-10-CM

## 2015-04-15 DIAGNOSIS — E0500 Thyrotoxicosis with diffuse goiter without thyrotoxic crisis or storm: Secondary | ICD-10-CM | POA: Diagnosis present

## 2015-04-15 DIAGNOSIS — R52 Pain, unspecified: Secondary | ICD-10-CM

## 2015-04-15 DIAGNOSIS — W1830XA Fall on same level, unspecified, initial encounter: Secondary | ICD-10-CM | POA: Diagnosis present

## 2015-04-15 DIAGNOSIS — Y9301 Activity, walking, marching and hiking: Secondary | ICD-10-CM

## 2015-04-15 DIAGNOSIS — Z01818 Encounter for other preprocedural examination: Secondary | ICD-10-CM

## 2015-04-15 DIAGNOSIS — Z96652 Presence of left artificial knee joint: Secondary | ICD-10-CM | POA: Diagnosis present

## 2015-04-15 DIAGNOSIS — S72011A Unspecified intracapsular fracture of right femur, initial encounter for closed fracture: Secondary | ICD-10-CM | POA: Diagnosis present

## 2015-04-15 DIAGNOSIS — Z419 Encounter for procedure for purposes other than remedying health state, unspecified: Secondary | ICD-10-CM

## 2015-04-15 DIAGNOSIS — M25551 Pain in right hip: Secondary | ICD-10-CM | POA: Diagnosis present

## 2015-04-15 LAB — BASIC METABOLIC PANEL
ANION GAP: 8 (ref 5–15)
BUN: 14 mg/dL (ref 6–20)
CO2: 26 mmol/L (ref 22–32)
Calcium: 8.7 mg/dL — ABNORMAL LOW (ref 8.9–10.3)
Chloride: 104 mmol/L (ref 101–111)
Creatinine, Ser: 0.55 mg/dL (ref 0.44–1.00)
GLUCOSE: 94 mg/dL (ref 65–99)
Potassium: 3.6 mmol/L (ref 3.5–5.1)
SODIUM: 138 mmol/L (ref 135–145)

## 2015-04-15 LAB — CBC WITH DIFFERENTIAL/PLATELET
BASOS ABS: 0 10*3/uL (ref 0–0.1)
Basophils Relative: 1 %
EOS ABS: 0.2 10*3/uL (ref 0–0.7)
EOS PCT: 3 %
HCT: 38.8 % (ref 35.0–47.0)
Hemoglobin: 12.8 g/dL (ref 12.0–16.0)
LYMPHS PCT: 29 %
Lymphs Abs: 1.6 10*3/uL (ref 1.0–3.6)
MCH: 30.1 pg (ref 26.0–34.0)
MCHC: 33.1 g/dL (ref 32.0–36.0)
MCV: 90.9 fL (ref 80.0–100.0)
Monocytes Absolute: 0.4 10*3/uL (ref 0.2–0.9)
Monocytes Relative: 6 %
Neutro Abs: 3.5 10*3/uL (ref 1.4–6.5)
Neutrophils Relative %: 61 %
PLATELETS: 242 10*3/uL (ref 150–440)
RBC: 4.27 MIL/uL (ref 3.80–5.20)
RDW: 14.3 % (ref 11.5–14.5)
WBC: 5.6 10*3/uL (ref 3.6–11.0)

## 2015-04-15 MED ORDER — CALCIUM CARBONATE 600 MG PO TABS
600.0000 mg | ORAL_TABLET | Freq: Two times a day (BID) | ORAL | Status: DC
  Filled 2015-04-15 (×2): qty 1

## 2015-04-15 MED ORDER — PROPRANOLOL HCL ER 60 MG PO CP24
60.0000 mg | ORAL_CAPSULE | Freq: Every day | ORAL | Status: DC
  Administered 2015-04-15 – 2015-04-17 (×3): 60 mg via ORAL
  Filled 2015-04-15 (×4): qty 1

## 2015-04-15 MED ORDER — MULTI-VITAMIN/MINERALS PO TABS: 1.0000 | ORAL_TABLET | Freq: Every day | ORAL | Status: DC

## 2015-04-15 MED ORDER — ADULT MULTIVITAMIN W/MINERALS CH
1.0000 | ORAL_TABLET | Freq: Every day | ORAL | Status: DC
  Administered 2015-04-15 – 2015-04-18 (×4): 1 via ORAL
  Filled 2015-04-15 (×4): qty 1

## 2015-04-15 MED ORDER — GABAPENTIN 300 MG PO CAPS
600.0000 mg | ORAL_CAPSULE | Freq: Every day | ORAL | Status: DC
  Administered 2015-04-15 – 2015-04-17 (×3): 600 mg via ORAL
  Filled 2015-04-15 (×3): qty 2

## 2015-04-15 MED ORDER — MORPHINE SULFATE 2 MG/ML IJ SOLN: 1.0000 mg | INTRAMUSCULAR | Status: DC | PRN

## 2015-04-15 MED ORDER — VITAMIN D 1000 UNITS PO TABS
1000.0000 [IU] | ORAL_TABLET | Freq: Every day | ORAL | Status: DC
  Administered 2015-04-16 – 2015-04-18 (×3): 1000 [IU] via ORAL
  Filled 2015-04-15 (×4): qty 1

## 2015-04-15 MED ORDER — HEPARIN SODIUM (PORCINE) 5000 UNIT/ML IJ SOLN
5000.0000 [IU] | Freq: Three times a day (TID) | INTRAMUSCULAR | Status: DC
  Administered 2015-04-15 (×2): 5000 [IU] via SUBCUTANEOUS
  Filled 2015-04-15 (×2): qty 1

## 2015-04-15 MED ORDER — LEVOTHYROXINE SODIUM 137 MCG PO TABS
137.0000 ug | ORAL_TABLET | Freq: Every day | ORAL | Status: DC
  Administered 2015-04-16 – 2015-04-18 (×3): 137 ug via ORAL
  Filled 2015-04-15 (×3): qty 1

## 2015-04-15 MED ORDER — OXYCODONE-ACETAMINOPHEN 5-325 MG PO TABS: 1.0000 | ORAL_TABLET | ORAL | Status: DC | PRN

## 2015-04-15 MED ORDER — SUMATRIPTAN SUCCINATE 100 MG PO TABS
100.0000 mg | ORAL_TABLET | ORAL | Status: DC | PRN
  Filled 2015-04-15: qty 1

## 2015-04-15 MED ORDER — CALCIUM CARBONATE ANTACID 500 MG PO CHEW
1.0000 | CHEWABLE_TABLET | Freq: Two times a day (BID) | ORAL | Status: DC
  Administered 2015-04-16 – 2015-04-18 (×4): 200 mg via ORAL
  Filled 2015-04-15 (×4): qty 1

## 2015-04-15 NOTE — Plan of Care (Signed)
Problem: Consults Goal: Diagnosis- Total Joint Replacement Outcome: Completed/Met Date Met:  04/15/15 Primary Total Hip

## 2015-04-15 NOTE — ED Notes (Signed)
Brought over from [facility] with fx right hip  S/p fall 3 weeks ago

## 2015-04-15 NOTE — ED Provider Notes (Signed)
[facility] Emergency Department Provider Note   ____________________________________________  Time seen: 2:50 PM I have reviewed the triage vital signs and the triage nursing note.  HISTORY  Chief Complaint Hip Pain   Historian Patient  HPI Diane Shea is a 64 y.o. female who is sent to the ED for treatment of right hip fracture. She saw her primary care physician today due to ongoing 3 weeks of right hip pain and obtained an x-ray as an outpatient which showed fracture and she was referred here. Continued moderate pain to the right hip. She has been walking on it on and off. She has chronic paresthesias to the right lower leg after her knee replacement on that side. She has also had a near placement on the left side as well. She follows with Dr. Marry Guan for orthopedics.    Past Medical History  Diagnosis Date  . Cancer 2003    Breast  . Graves disease   . Migraines   . Hypertension 2006  . Encounter for screening colonoscopy 04/15/2013    Patient Active Problem List   Diagnosis Date Noted  . Encounter for screening colonoscopy 04/15/2013    Past Surgical History  Procedure Laterality Date  . 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 Breast lumpectomy Left 2003  . Breast biopsy Left 2003    sentinel node  . Breast surgery Left 2003    mammosite placement  . Colonoscopy  2004  . Abdominal hysterectomy  2004  . Foot surgery    . Replacement total knee Left 2013    Current Outpatient Rx  Name  Route  Sig  Dispense  Refill  . aspirin 81 MG tablet   Oral   Take 81 mg by mouth daily.         . calcium carbonate (OS-CAL) 600 MG TABS tablet   Oral   Take 600 mg by mouth 2 (two) times daily with a meal.         . cholecalciferol (VITAMIN D) 1000 UNITS tablet   Oral   Take 1,000 Units by mouth daily.         . fish oil-omega-3 fatty acids 1000 MG capsule   Oral   Take 2 g by mouth daily.         Marland Kitchen levothyroxine (SYNTHROID,  LEVOTHROID) 137 MCG tablet   Oral   Take 1 tablet by mouth daily.         . Multiple Vitamins-Minerals (MULTIVITAMIN WITH MINERALS) tablet   Oral   Take 1 tablet by mouth daily.         . polyethylene glycol powder (GLYCOLAX/MIRALAX) powder      255 grams one bottle for colonoscopy prep   255 g   0   . propranolol ER (INDERAL LA) 60 MG 24 hr capsule   Oral   Take 1 capsule by mouth daily.         . SUMAtriptan (IMITREX) 100 MG tablet   Oral   Take 1 tablet by mouth daily.           Allergies Review of patient's allergies indicates no known allergies.  No family history on file.  Social History Social History  Substance Use Topics  . Smoking status: Never Smoker   . Smokeless tobacco: Never Used  . Alcohol Use: No    Review of Systems  Constitutional: Negative for fever. Eyes: Negative for visual changes. ENT: Negative for sore throat. Cardiovascular:  Negative for chest pain. Respiratory: Negative for shortness of breath. Gastrointestinal: Negative for abdominal pain, vomiting and diarrhea. Genitourinary: Negative for dysuria. Musculoskeletal: Negative for back pain. Skin: Negative for rash. Neurological: Negative for headaches, focal weakness or numbness. 10 point Review of Systems otherwise negative ____________________________________________   PHYSICAL EXAM:  VITAL SIGNS: ED Triage Vitals  Enc Vitals Group     BP 04/15/15 1215 134/78 mmHg     Pulse Rate 04/15/15 1215 57     Resp 04/15/15 1215 18     Temp 04/15/15 1215 98.2 F (36.8 C)     Temp Source 04/15/15 1215 Oral     SpO2 04/15/15 1215 97 %     Weight 04/15/15 1215 172 lb (78.019 kg)     Height 04/15/15 1215 5\' 3"  (1.6 m)     Head Cir --      Peak Flow --      Pain Score 04/15/15 1216 0     Pain Loc --      Pain Edu? --      Excl. in [place]? --      Constitutional: Alert and oriented. Well appearing and in no distress. Eyes: Conjunctivae are normal. PERRL. Normal extraocular  movements. ENT   Head: Normocephalic and atraumatic.   Nose: No congestion/rhinnorhea.   Mouth/Throat: Mucous membranes are moist.   Neck: No stridor. Cardiovascular/Chest: Normal rate, regular rhythm.  No murmurs, rubs, or gallops. Respiratory: Normal respiratory effort without tachypnea nor retractions. Breath sounds are clear and equal bilaterally. No wheezes/rales/rhonchi. Gastrointestinal: Soft. No distention, no guarding, no rebound. Nontender   Genitourinary/rectal: Deferred Musculoskeletal: right hip tenderness with palpation and range of motion. Neurovascularly intact. Neurologic:  Normal speech and language. No gross or focal neurologic deficits are appreciated. Skin:  Skin is warm, dry and intact. No rash noted. Psychiatric: Mood and affect are normal. Speech and behavior are normal. Patient exhibits appropriate insight and judgment.  ____________________________________________   EKG I, Lisa Roca, MD, the attending physician have personally viewed and interpreted all ECGs.  56 bpm. Sinus bradycardia. Narrow QRS. Normal axis.LVH Normal ST and T-wave. ____________________________________________  LABS (pertinent positives/negatives)  Basic metabolic panel without significant abnormality CBC within normal limits  ____________________________________________  RADIOLOGY All Xrays were viewed by me. Imaging interpreted by Radiologist.  Right hip with pelvis: Subcapital right femur fracture Chest: portable:   IMPRESSION: No convincing evidence of acute cardiopulmonary disease.  Vague increased density projecting over the right apex, possibly artifactual. Consider further evaluation with PA and lateral radiographs, which can be performed non emergently. __________________________________________  PROCEDURES  Procedure(s) performed: None Critical Care performed: None   ____________________________________________   ED COURSE / ASSESSMENT AND  PLAN  CONSULTATIONS: Phone consultation with patient's orthopedic surgeon, Dr. Marry Guan.  Face to face with hospitalist for admission  Pertinent labs & imaging results that were available during my care of the patient were reviewed by me and considered in my medical decision making (see chart for details).   Patient's hip fracture was confirmed by x-ray. Patient requested Dr. Marry Guan personally for surgery. I was able to get in contact with Dr. Marry Guan who agreed to see this patient in the hospital. Patient will be admitted to the hospitalist team.  Patient / Family / Caregiver informed of clinical course, medical decision-making process, and agree with plan.   I discussed return precautions, follow-up instructions, and discharged instructions with patient and/or family.  ___________________________________________   FINAL CLINICAL IMPRESSION(S) / ED DIAGNOSES   Final diagnoses:  Hip  fracture, right, closed, initial encounter       Lisa Roca, MD 04/15/15 1600

## 2015-04-15 NOTE — H&P (Addendum)
[facility] [place] NAME: Diane Shea    MR#:  [id number]  DATE OF BIRTH:  06-03-1951  DATE OF ADMISSION:  04/15/2015  PRIMARY CARE PHYSICIAN: Kirk Ruths., MD   REQUESTING/REFERRING PHYSICIAN: Lisa Roca  CHIEF COMPLAINT:   Chief Complaint  Patient presents with  . Hip Pain    HISTORY OF PRESENT ILLNESS: Diane Shea  is a 64 y.o. female with a known history of hypertension, Graves' disease- had a fall while walking her dog 2 weeks ago, and she fell on her right side since then she has pain in her right hip. She is able to walk but the pain continued to bother her so finally see decided to come to emergency room. In ER she was found to have a subcapital fracture of right hip and so ER physician spoke to her family orthopedic doctor who did her knee replacement surgeries in the past with Dr. Lake Bells, and he suggested to admit her for possible surgery.  PAST MEDICAL HISTORY:   Past Medical History  Diagnosis Date  . Cancer 2003    Breast  . Graves disease   . Migraines   . Hypertension 2006  . Encounter for screening colonoscopy 04/15/2013    PAST SURGICAL HISTORY:  Past Surgical History  Procedure Laterality Date  . 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 Breast lumpectomy Left 2003  . Breast biopsy Left 2003    sentinel node  . Breast surgery Left 2003    mammosite placement  . Colonoscopy  2004  . Abdominal hysterectomy  2004  . Foot surgery    . Replacement total knee Left 2013    SOCIAL HISTORY:  Social History  Substance Use Topics  . Smoking status: Never Smoker   . Smokeless tobacco: Never Used  . Alcohol Use: No    FAMILY HISTORY:  Family History  Problem Relation Age of Onset  . CAD Father   . Prostate cancer Father     DRUG ALLERGIES: No Known Allergies  REVIEW OF SYSTEMS:   CONSTITUTIONAL: No fever, fatigue or weakness.  EYES: No blurred or double vision.  EARS, NOSE, AND THROAT: No  tinnitus or ear pain.  RESPIRATORY: No cough, shortness of breath, wheezing or hemoptysis.  CARDIOVASCULAR: No chest pain, orthopnea, edema.  GASTROINTESTINAL: No nausea, vomiting, diarrhea or abdominal pain.  GENITOURINARY: No dysuria, hematuria.  ENDOCRINE: No polyuria, nocturia,  HEMATOLOGY: No anemia, easy bruising or bleeding SKIN: No rash or lesion. MUSCULOSKELETAL: right hip pain .  NEUROLOGIC: No tingling, numbness, weakness.  PSYCHIATRY: No anxiety or depression.   MEDICATIONS AT HOME:  Prior to Admission medications   Medication Sig Start Date End Date Taking? Authorizing Provider  ALPHA LIPOIC ACID PO Take 1 capsule by mouth at bedtime.    Yes Historical Provider, MD  calcium carbonate (OS-CAL) 600 MG TABS tablet Take 600 mg by mouth 2 (two) times daily.    Yes Historical Provider, MD  cholecalciferol (VITAMIN D) 1000 UNITS tablet Take 1,000 Units by mouth daily.   Yes Historical Provider, MD  gabapentin (NEURONTIN) 300 MG capsule Take 600 mg by mouth at bedtime.   Yes Historical Provider, MD  levothyroxine (SYNTHROID, LEVOTHROID) 137 MCG tablet Take 1 tablet by mouth daily.   Yes Historical Provider, MD  Multiple Vitamins-Minerals (MULTIVITAMIN WITH MINERALS) tablet Take 1 tablet by mouth daily.   Yes Historical Provider, MD  propranolol ER (INDERAL LA) 60 MG 24 hr  capsule Take 1 capsule by mouth at bedtime.    Yes Historical Provider, MD  SUMAtriptan (IMITREX) 100 MG tablet Take 1 tablet by mouth every 2 (two) hours as needed for migraine.    Yes Historical Provider, MD  polyethylene glycol powder (GLYCOLAX/MIRALAX) powder 255 grams one bottle for colonoscopy prep Patient not taking: Reported on 04/15/2015 04/15/13   Robert Bellow, MD      PHYSICAL EXAMINATION:   VITAL SIGNS: Blood pressure 155/69, pulse 52, temperature 98.2 F (36.8 C), temperature source Oral, resp. rate 18, height 5\' 3"  (1.6 m), weight 78.019 kg (172 lb), SpO2 97 %.  GENERAL:  64 y.o.-year-old  patient lying in the bed with no acute distress.  EYES: Pupils equal, round, reactive to light and accommodation. No scleral icterus. Extraocular muscles intact.  HEENT: Head atraumatic, normocephalic. Oropharynx and nasopharynx clear.  NECK:  Supple, no jugular venous distention. No thyroid enlargement, no tenderness.  LUNGS: Normal breath sounds bilaterally, no wheezing, rales,rhonchi or crepitation. No use of accessory muscles of respiration.  CARDIOVASCULAR: S1, S2 normal. No murmurs, rubs, or gallops.  ABDOMEN: Soft, nontender, nondistended. Bowel sounds present. No organomegaly or mass.  EXTREMITIES: No pedal edema, cyanosis, or clubbing. Tenderness in right hip NEUROLOGIC: Cranial nerves II through XII are intact. Muscle strength 5/5 in all extremities. Sensation intact. Gait not checked.  PSYCHIATRIC: The patient is alert and oriented x 3.  SKIN: No obvious rash, lesion, or ulcer.   LABORATORY PANEL:   CBC  Recent Labs Lab 04/15/15 1515  WBC 5.6  HGB 12.8  HCT 38.8  PLT 242  MCV 90.9  MCH 30.1  MCHC 33.1  RDW 14.3  LYMPHSABS 1.6  MONOABS 0.4  EOSABS 0.2  BASOSABS 0.0   ------------------------------------------------------------------------------------------------------------------  Chemistries   Recent Labs Lab 04/15/15 1515  NA 138  K 3.6  CL 104  CO2 26  GLUCOSE 94  BUN 14  CREATININE 0.55  CALCIUM 8.7*   ------------------------------------------------------------------------------------------------------------------ estimated creatinine clearance is 69.3 mL/min (by C-G formula based on Cr of 0.55). ------------------------------------------------------------------------------------------------------------------ No results for input(s): TSH, T4TOTAL, T3FREE, THYROIDAB in the last 72 hours.  Invalid input(s): FREET3   Coagulation profile No results for input(s): INR, PROTIME in the last 168  hours. ------------------------------------------------------------------------------------------------------------------- No results for input(s): DDIMER in the last 72 hours. -------------------------------------------------------------------------------------------------------------------  Cardiac Enzymes No results for input(s): CKMB, TROPONINI, MYOGLOBIN in the last 168 hours.  Invalid input(s): CK ------------------------------------------------------------------------------------------------------------------ Invalid input(s): POCBNP  ---------------------------------------------------------------------------------------------------------------  Urinalysis    Component Value Date/Time   COLORURINE Yellow 09/09/2014 1114   APPEARANCEUR Clear 09/09/2014 1114   LABSPEC 1.014 09/09/2014 1114   PHURINE 6.0 09/09/2014 1114   GLUCOSEU Negative 09/09/2014 1114   HGBUR Negative 09/09/2014 1114   BILIRUBINUR Negative 09/09/2014 1114   KETONESUR Negative 09/09/2014 1114   PROTEINUR Negative 09/09/2014 1114   NITRITE Negative 09/09/2014 1114   LEUKOCYTESUR Negative 09/09/2014 1114     RADIOLOGY: Dg Chest Port 1 View  04/15/2015   CLINICAL DATA:  Fall 3 weeks ago.  Hip fracture.  Preoperative.  EXAM: PORTABLE CHEST - 1 VIEW  COMPARISON:  Report of a a 07/30/2002 chest radiograph.  FINDINGS: Apical lordotic positioning. Midline trachea. Normal heart size. Mildly tortuous descending thoracic aorta. No pleural effusion or pneumothorax. Surgical clips within in the left breast or left axilla. Probable scarring at the left lung base ; partial obscuration of left hemidiaphragm laterally. Vague increased density projecting over the right apex could be artifactual.  IMPRESSION: No convincing evidence of  acute cardiopulmonary disease.  Vague increased density projecting over the right apex, possibly artifactual. Consider further evaluation with PA and lateral radiographs, which can be performed non  emergently.   Electronically Signed   By: Abigail Miyamoto M.D.   On: 04/15/2015 15:42   Dg Hip Unilat With Pelvis 2-3 Views Right  04/15/2015   CLINICAL DATA:  [place] 3 weeks ago.  Right hip pain.  EXAM: DG HIP (WITH OR WITHOUT PELVIS) 2-3V RIGHT  COMPARISON:  None.  FINDINGS: The pelvic bony ring is intact. There is sclerosis at the right SI joint. Probable scoliosis in the lower lumbar spine. There is a displaced fracture of the proximal right femur. This is suggestive for a subcapital fracture with some impaction. The right femoral head is located.  IMPRESSION: Subcapital right femur fracture.   Electronically Signed   By: Markus Daft M.D.   On: 04/15/2015 15:45    IMPRESSION AND PLAN: * Right hip fracture  Admit the patient to medical services as per hospital policy , and orthopedic consult for possible surgery.  heparin for DVT prophylaxis and morphine and Percocet for pain management.  She can go for surgery likely tomorrow as well as the family is planning.  Berenice Primas' disease  Continue levothyroxine  * History of hypertension  Patient says that her blood pressure is under control without any medication I would just continue her low-sodium diet.  * Migraine  Currently no exacerbation, continue propranolol and give sumatriptan if needed.  * Osteopenia  She was taking calcium and vitamin D at home I would suggest continue same.  * Density in right lung apex   Found on Xray- will do 2 view chest xray as she have breast ca history.  All the records are reviewed and case discussed with ED provider. Management plans discussed with the patient, family and they are in agreement.  CODE STATUS: Full  TOTAL TIME TAKING CARE OF THIS PATIENT: 62minutes.    Vaughan Basta M.D on 04/15/2015   Between 7am to 6pm - Pager - [phone]  After 6pm go to [email or web address] - password [email or web address] [facility]  Office  [phone]  CC: Primary care physician;  Kirk Ruths., MD

## 2015-04-15 NOTE — ED Notes (Signed)
Pt from [facility] with reports that she fell 3 weeks ago, her dog tripped her. Confirmed hip fracture.

## 2015-04-16 ENCOUNTER — Encounter: Payer: Self-pay | Admitting: Anesthesiology

## 2015-04-16 ENCOUNTER — Inpatient Hospital Stay: Payer: Medicare Other

## 2015-04-16 ENCOUNTER — Inpatient Hospital Stay: Payer: Medicare Other | Admitting: Anesthesiology

## 2015-04-16 ENCOUNTER — Encounter
Admission: EM | Disposition: A | Discharge status: Home / Self Care | Payer: Self-pay | Source: Home / Self Care | Attending: Internal Medicine

## 2015-04-16 HISTORY — PX: HIP PINNING,CANNULATED: SHX1758

## 2015-04-16 LAB — BASIC METABOLIC PANEL
Anion gap: 10 (ref 5–15)
BUN: 15 mg/dL (ref 6–20)
CO2: 28 mmol/L (ref 22–32)
Calcium: 9.2 mg/dL (ref 8.9–10.3)
Chloride: 103 mmol/L (ref 101–111)
Creatinine, Ser: 0.67 mg/dL (ref 0.44–1.00)
GFR calc Af Amer: >60 mL/min (ref >60)
GFR calc non Af Amer: >60 mL/min (ref >60)
GLUCOSE: 91 mg/dL (ref 65–99)
POTASSIUM: 3.9 mmol/L (ref 3.5–5.1)
Sodium: 141 mmol/L (ref 135–145)

## 2015-04-16 LAB — CBC
HCT: 41.3 % (ref 35.0–47.0)
Hemoglobin: 13.8 g/dL (ref 12.0–16.0)
MCH: 30.7 pg (ref 26.0–34.0)
MCHC: 33.3 g/dL (ref 32.0–36.0)
MCV: 92 fL (ref 80.0–100.0)
Platelets: 254 10*3/uL (ref 150–440)
RBC: 4.49 MIL/uL (ref 3.80–5.20)
RDW: 14.8 % — ABNORMAL HIGH (ref 11.5–14.5)
WBC: 4.6 10*3/uL (ref 3.6–11.0)

## 2015-04-16 SURGERY — FIXATION, FEMUR, NECK, PERCUTANEOUS, USING SCREW
Anesthesia: Spinal | Laterality: Right

## 2015-04-16 MED ORDER — SODIUM CHLORIDE 0.9 % IV SOLN
INTRAVENOUS | Status: DC
  Administered 2015-04-16: 09:00:00 via INTRAVENOUS

## 2015-04-16 MED ORDER — ACETAMINOPHEN 10 MG/ML IV SOLN
1000.0000 mg | Freq: Four times a day (QID) | INTRAVENOUS | Status: AC
  Administered 2015-04-17 (×3): 1000 mg via INTRAVENOUS
  Filled 2015-04-16 (×4): qty 100

## 2015-04-16 MED ORDER — CEFAZOLIN SODIUM-DEXTROSE 2-3 GM-% IV SOLR
2.0000 g | Freq: Four times a day (QID) | INTRAVENOUS | Status: AC
  Administered 2015-04-16 – 2015-04-17 (×4): 2 g via INTRAVENOUS
  Filled 2015-04-16 (×4): qty 50

## 2015-04-16 MED ORDER — ONDANSETRON HCL 4 MG/2ML IJ SOLN
INTRAMUSCULAR | Status: DC | PRN
  Administered 2015-04-16: 4 mg via INTRAVENOUS

## 2015-04-16 MED ORDER — ACETAMINOPHEN 10 MG/ML IV SOLN
INTRAVENOUS | Status: DC | PRN
  Administered 2015-04-16: 1000 mg via INTRAVENOUS

## 2015-04-16 MED ORDER — NEOMYCIN-POLYMYXIN B GU 40-200000 IR SOLN
Status: DC | PRN
  Administered 2015-04-16: 4 mL

## 2015-04-16 MED ORDER — SODIUM CHLORIDE 0.9 % IV SOLN
INTRAVENOUS | Status: DC
  Administered 2015-04-16 – 2015-04-17 (×2): via INTRAVENOUS

## 2015-04-16 MED ORDER — METOCLOPRAMIDE HCL 10 MG PO TABS
10.0000 mg | ORAL_TABLET | Freq: Three times a day (TID) | ORAL | Status: DC
  Administered 2015-04-16 – 2015-04-18 (×5): 10 mg via ORAL
  Filled 2015-04-16 (×7): qty 1

## 2015-04-16 MED ORDER — FENTANYL CITRATE (PF) 100 MCG/2ML IJ SOLN
25.0000 ug | INTRAMUSCULAR | Status: AC | PRN
  Administered 2015-04-16 (×6): 25 ug via INTRAVENOUS

## 2015-04-16 MED ORDER — MORPHINE SULFATE 2 MG/ML IJ SOLN: 2.0000 mg | INTRAMUSCULAR | Status: DC | PRN

## 2015-04-16 MED ORDER — FERROUS SULFATE 325 (65 FE) MG PO TABS
325.0000 mg | ORAL_TABLET | Freq: Two times a day (BID) | ORAL | Status: DC
  Administered 2015-04-17 – 2015-04-18 (×3): 325 mg via ORAL
  Filled 2015-04-16 (×3): qty 1

## 2015-04-16 MED ORDER — ENOXAPARIN SODIUM 30 MG/0.3ML ~~LOC~~ SOLN
30.0000 mg | SUBCUTANEOUS | Status: DC
  Administered 2015-04-17: 30 mg via SUBCUTANEOUS
  Filled 2015-04-16: qty 0.3

## 2015-04-16 MED ORDER — BISACODYL 10 MG RE SUPP: 10.0000 mg | Freq: Every day | RECTAL | Status: DC | PRN

## 2015-04-16 MED ORDER — ACETAMINOPHEN 325 MG PO TABS: 650.0000 mg | ORAL_TABLET | Freq: Four times a day (QID) | ORAL | Status: DC | PRN

## 2015-04-16 MED ORDER — FLEET ENEMA 7-19 GM/118ML RE ENEM: 1.0000 | ENEMA | Freq: Once | RECTAL | Status: DC | PRN

## 2015-04-16 MED ORDER — SENNOSIDES-DOCUSATE SODIUM 8.6-50 MG PO TABS
1.0000 | ORAL_TABLET | Freq: Two times a day (BID) | ORAL | Status: DC
  Administered 2015-04-16 – 2015-04-18 (×4): 1 via ORAL
  Filled 2015-04-16 (×4): qty 1

## 2015-04-16 MED ORDER — MIDAZOLAM HCL 2 MG/2ML IJ SOLN
INTRAMUSCULAR | Status: DC | PRN
  Administered 2015-04-16: 2 mg via INTRAVENOUS

## 2015-04-16 MED ORDER — LACTATED RINGERS IV SOLN
INTRAVENOUS | Status: DC | PRN
  Administered 2015-04-16 (×2): via INTRAVENOUS

## 2015-04-16 MED ORDER — ONDANSETRON HCL 4 MG PO TABS: 4.0000 mg | ORAL_TABLET | Freq: Four times a day (QID) | ORAL | Status: DC | PRN

## 2015-04-16 MED ORDER — MENTHOL 3 MG MT LOZG: 1.0000 | LOZENGE | OROMUCOSAL | Status: DC | PRN

## 2015-04-16 MED ORDER — ONDANSETRON HCL 4 MG/2ML IJ SOLN: 4.0000 mg | Freq: Once | INTRAMUSCULAR | Status: DC | PRN

## 2015-04-16 MED ORDER — LIDOCAINE HCL (CARDIAC) 20 MG/ML IV SOLN
INTRAVENOUS | Status: DC | PRN
  Administered 2015-04-16: 50 mg via INTRAVENOUS

## 2015-04-16 MED ORDER — MAGNESIUM HYDROXIDE 400 MG/5ML PO SUSP: 30.0000 mL | Freq: Every day | ORAL | Status: DC | PRN

## 2015-04-16 MED ORDER — ONDANSETRON HCL 4 MG/2ML IJ SOLN: 4.0000 mg | Freq: Four times a day (QID) | INTRAMUSCULAR | Status: DC | PRN

## 2015-04-16 MED ORDER — KETAMINE HCL 50 MG/ML IJ SOLN
INTRAMUSCULAR | Status: DC | PRN
  Administered 2015-04-16: 30 mg via INTRAMUSCULAR

## 2015-04-16 MED ORDER — FENTANYL CITRATE (PF) 100 MCG/2ML IJ SOLN
INTRAMUSCULAR | Status: DC | PRN
  Administered 2015-04-16 (×2): 25 ug via INTRAVENOUS
  Administered 2015-04-16: 50 ug via INTRAVENOUS
  Administered 2015-04-16: 25 ug via INTRAVENOUS
  Administered 2015-04-16: 50 ug via INTRAVENOUS
  Administered 2015-04-16: 25 ug via INTRAVENOUS

## 2015-04-16 MED ORDER — OXYCODONE HCL 5 MG PO TABS
5.0000 mg | ORAL_TABLET | ORAL | Status: DC | PRN
  Administered 2015-04-16 – 2015-04-17 (×5): 5 mg via ORAL
  Administered 2015-04-18: 10 mg via ORAL
  Administered 2015-04-18: 5 mg via ORAL
  Filled 2015-04-16 (×2): qty 1
  Filled 2015-04-16: qty 2
  Filled 2015-04-16 (×4): qty 1

## 2015-04-16 MED ORDER — GLYCOPYRROLATE 0.2 MG/ML IJ SOLN
INTRAMUSCULAR | Status: DC | PRN
  Administered 2015-04-16: 0.2 mg via INTRAVENOUS

## 2015-04-16 MED ORDER — PHENOL 1.4 % MT LIQD: 1.0000 | OROMUCOSAL | Status: DC | PRN

## 2015-04-16 MED ORDER — METOCLOPRAMIDE HCL 5 MG PO TABS: 5.0000 mg | ORAL_TABLET | Freq: Three times a day (TID) | ORAL | Status: DC | PRN

## 2015-04-16 MED ORDER — DEXAMETHASONE SODIUM PHOSPHATE 4 MG/ML IJ SOLN
INTRAMUSCULAR | Status: DC | PRN
  Administered 2015-04-16: 5 mg via INTRAVENOUS

## 2015-04-16 MED ORDER — ACETAMINOPHEN 650 MG RE SUPP: 650.0000 mg | Freq: Four times a day (QID) | RECTAL | Status: DC | PRN

## 2015-04-16 MED ORDER — CEFAZOLIN SODIUM-DEXTROSE 2-3 GM-% IV SOLR
2.0000 g | INTRAVENOUS | Status: AC
  Administered 2015-04-16: 2 g via INTRAVENOUS
  Filled 2015-04-16: qty 50

## 2015-04-16 MED ORDER — PROPOFOL 10 MG/ML IV BOLUS
INTRAVENOUS | Status: DC | PRN
  Administered 2015-04-16: 100 mg via INTRAVENOUS
  Administered 2015-04-16: 150 mg via INTRAVENOUS

## 2015-04-16 MED ORDER — TRAMADOL HCL 50 MG PO TABS: 50.0000 mg | ORAL_TABLET | ORAL | Status: DC | PRN

## 2015-04-16 MED ORDER — METOCLOPRAMIDE HCL 5 MG/ML IJ SOLN: 5.0000 mg | Freq: Three times a day (TID) | INTRAMUSCULAR | Status: DC | PRN

## 2015-04-16 SURGICAL SUPPLY — 26 items
BAG COUNTER SPONGE EZ (MISCELLANEOUS) IMPLANT
BIT DRILL CANN LRG QC 5X300 (BIT) ×3 IMPLANT
CANISTER SUCT 1200ML W/VALVE (MISCELLANEOUS) ×3 IMPLANT
CATH FOL LEG HOLDER (MISCELLANEOUS) ×3 IMPLANT
COUNTER SPONGE BAG EZ (MISCELLANEOUS)
DRSG OPSITE POSTOP 3X4 (GAUZE/BANDAGES/DRESSINGS) ×3 IMPLANT
DURAPREP 26ML APPLICATOR (WOUND CARE) ×3 IMPLANT
GAUZE SPONGE 4X4 12PLY STRL (GAUZE/BANDAGES/DRESSINGS) ×3 IMPLANT
GLOVE BIO SURGEON STRL SZ8 (GLOVE) ×3 IMPLANT
GLOVE BIOGEL M STRL SZ7.5 (GLOVE) ×3 IMPLANT
GLOVE INDICATOR 8.0 STRL GRN (GLOVE) ×3 IMPLANT
GOWN STRL REUS W/ TWL LRG LVL3 (GOWN DISPOSABLE) ×1 IMPLANT
GOWN STRL REUS W/TWL LRG LVL3 (GOWN DISPOSABLE) ×2
GOWN STRL REUS W/TWL LRG LVL4 (GOWN DISPOSABLE) ×3 IMPLANT
GOWN STRL REUS W/TWL XL LVL4 (GOWN DISPOSABLE) IMPLANT
GUIDEWIRE THREADED 2.8 (WIRE) ×9 IMPLANT
NS IRRIG 1000ML POUR BTL (IV SOLUTION) ×3 IMPLANT
PACK HIP COMPR (MISCELLANEOUS) ×3 IMPLANT
PAD GROUND ADULT SPLIT (MISCELLANEOUS) ×3 IMPLANT
STAPLER SKIN PROX 35W (STAPLE) ×3 IMPLANT
STRAP SAFETY BODY (MISCELLANEOUS) ×3 IMPLANT
SUT VIC AB 0 CT2 27 (SUTURE) ×3 IMPLANT
SUT VIC AB 1 CT1 36 (SUTURE) ×6 IMPLANT
SUT VIC AB 2-0 CT2 27 (SUTURE) ×3 IMPLANT
TAPE MICROFOAM 4IN (TAPE) ×3 IMPLANT
WATER STERILE IRR 1000ML POUR (IV SOLUTION) IMPLANT

## 2015-04-16 NOTE — Clinical Social Work Note (Signed)
Clinical Social Work Assessment  Patient Details  Name: Diane Shea MRN: [id number] Date of Birth: [birth year]  Date of referral:  04/16/15               Reason for consult:  Facility Placement                Permission sought to share information with:  Chartered certified accountant granted to share information::  No  Name::        Agency::     Relationship::     Contact Information:     Housing/Transportation Living arrangements for the past 2 months:  Single Family Home Source of Information:  Patient Patient Interpreter Needed:  None Criminal Activity/Legal Involvement Pertinent to Current Situation/Hospitalization:  No - Comment as needed Significant Relationships:  Spouse Lives with:  Spouse Do you feel safe going back to the place where you live?  Yes Need for family participation in patient care:  Yes (Comment)  Care giving concerns: Patient lives with her husband [name] in [place].    Social Worker assessment / plan: Holiday representative (CSW) received SNF consult. Patient is having surgery today. CSW met with patient prior to surgery to discuss D/C plan. CSW introduced self and explained role of CSW department. Patient reported that she lives with her husband and plans on going home after surgery. CSW explained that PT will work with patient tomorrow and recommend SNF or Home Health. Patient prefers home health. RN Case Manager aware of above.    Employment status:  Retired Nurse, adult PT Recommendations:  Not assessed at this time Information / Referral to community resources:  [place]  Patient/Family's Response to care: Patient prefers home health.   Patient/Family's Understanding of and Emotional Response to Diagnosis, Current Treatment, and Prognosis: Patient was pleasant throughout assessment.  Emotional Assessment Appearance:  Appears stated age Attitude/Demeanor/Rapport:    Affect  (typically observed):  Accepting, Pleasant Orientation:  Oriented to Self, Oriented to Place, Oriented to  Time, Oriented to Situation Alcohol / Substance use:  Not Applicable Psych involvement (Current and /or in the community):  No (Comment)  Discharge Needs  Concerns to be addressed:  Discharge Planning Concerns Readmission within the last 30 days:  No Current discharge risk:  None Barriers to Discharge:  Continued Medical Work up   Elwyn Reach 04/16/2015, 5:56 PM

## 2015-04-16 NOTE — Progress Notes (Addendum)
Subjective :Patient is day 1 of admission for impacted right hip fracture. Injury occurred 3 weeks ago. Has been ambulating on the right leg and just not improving. Was seen yesterday by Dr. Frazier Richards. X-rays were made. This revealed a fracture. Patient was subsequently admitted to the hospital for surgery Patient reports pain as mild.   no nausea and no vomiting No chest pains or shortness of breath     Objective: Vital signs in last 24 hours: Temp:  [97.7 F (36.5 C)-98.3 F (36.8 C)] 97.7 F (36.5 C) (08/11 0500) Pulse Rate:  [50-65] 56 (08/11 0500) Resp:  [17-18] 17 (08/11 0500) BP: (108-155)/(65-78) 108/69 mmHg (08/11 0500) SpO2:  [97 %-100 %] 99 % (08/11 0500) Weight:  [78.019 kg (172 lb)] 78.019 kg (172 lb) (08/10 1215) Wound check-none  no sensory deficits noted and normal light touch sensation Good range of motion of right lower extremity with mild discomfort.   Intake/Output from previous day:   Intake/Output this shift:     Recent Labs  04/15/15 1515 04/16/15 0612  HGB 12.8 13.8    Recent Labs  04/15/15 1515 04/16/15 0612  WBC 5.6 4.6  RBC 4.27 4.49  HCT 38.8 41.3  PLT 242 254    Recent Labs  04/15/15 1515 04/16/15 0612  NA 138 141  K 3.6 3.9  CL 104 103  CO2 26 28  BUN 14 15  CREATININE 0.55 0.67  GLUCOSE 94 91  CALCIUM 8.7* 9.2   No results for input(s): LABPT, INR in the last 72 hours.  Neurologically intact Neurovascular intact Sensation intact distally Intact pulses distally Dorsiflexion/Plantar flexion intact  Assessment/Plan: Nothing by mouth after 8 AM Surgery pending late this afternoon. Probable pinning. Case management to assist with discharge planning Physical therapy to begin tomorrow Labs in am Plan to discharge Saturday or Sunday   WOLFE,JON R. 04/16/2015, 7:52 AM

## 2015-04-16 NOTE — Progress Notes (Signed)
[facility] [place] NAME: Diane Shea    MR#:  [id number]  DATE OF BIRTH:  [birth year]  SUBJECTIVE:  CHIEF COMPLAINT:   Chief Complaint  Patient presents with  . Hip Pain   Doing well, pain is controlled.  REVIEW OF SYSTEMS:   Review of Systems  Constitutional: Negative for fever.  Respiratory: Negative for shortness of breath.   Cardiovascular: Negative for chest pain and palpitations.  Gastrointestinal: Negative for nausea, vomiting and abdominal pain.  Genitourinary: Negative for dysuria.    DRUG ALLERGIES:  No Known Allergies  VITALS:  Blood pressure 136/72, pulse 49, temperature 98 F (36.7 C), temperature source Oral, resp. rate 16, height 5\' 3"  (1.6 m), weight 78.019 kg (172 lb), SpO2 100 %.  PHYSICAL EXAMINATION:  GENERAL:  64 y.o.-year-old patient lying in the bed with no acute distress.  EYES: Pupils equal, round, reactive to light and accommodation. No scleral icterus. Extraocular muscles intact.  HEENT: Head atraumatic, normocephalic. Oropharynx and nasopharynx clear.  NECK:  Supple, no jugular venous distention. No thyroid enlargement, no tenderness.  LUNGS: Normal breath sounds bilaterally, no wheezing, rales,rhonchi or crepitation. No use of accessory muscles of respiration.  CARDIOVASCULAR: S1, S2 normal. No murmurs, rubs, or gallops.  ABDOMEN: Soft, nontender, nondistended. Bowel sounds present. No organomegaly or mass.  EXTREMITIES: No pedal edema, cyanosis, or clubbing.  NEUROLOGIC: Cranial nerves II through XII are intact. Muscle strength 5/5 in all extremities. Sensation intact. Gait not checked.  PSYCHIATRIC: The patient is alert and oriented x 3.  SKIN: No obvious rash, lesion, or ulcer.    LABORATORY PANEL:   CBC  Recent Labs Lab 04/16/15 0612  WBC 4.6  HGB 13.8  HCT 41.3  PLT 254    ------------------------------------------------------------------------------------------------------------------  Chemistries   Recent Labs Lab 04/16/15 0612  NA 141  K 3.9  CL 103  CO2 28  GLUCOSE 91  BUN 15  CREATININE 0.67  CALCIUM 9.2   ------------------------------------------------------------------------------------------------------------------  Cardiac Enzymes No results for input(s): TROPONINI in the last 168 hours. ------------------------------------------------------------------------------------------------------------------  RADIOLOGY:  Dg Chest 2 View  04/16/2015   CLINICAL DATA:  Pre operative respiratory exam. Hip fracture. Abnormal portable radiograph.  EXAM: CHEST  2 VIEW  COMPARISON:  04/15/2015  FINDINGS: The heart size and pulmonary vascularity are normal. The lungs are clear. Benign calcification in the left breast. No osseous abnormality. The density seen at the right apex on the prior study was a confluence of osseous structures.  IMPRESSION: No active cardiopulmonary disease.   Electronically Signed   By: Lorriane Shire M.D.   On: 04/16/2015 07:32   Dg Chest Port 1 View  04/15/2015   CLINICAL DATA:  Fall 3 weeks ago.  Hip fracture.  Preoperative.  EXAM: PORTABLE CHEST - 1 VIEW  COMPARISON:  Report of a a 07/30/2002 chest radiograph.  FINDINGS: Apical lordotic positioning. Midline trachea. Normal heart size. Mildly tortuous descending thoracic aorta. No pleural effusion or pneumothorax. Surgical clips within in the left breast or left axilla. Probable scarring at the left lung base ; partial obscuration of left hemidiaphragm laterally. Vague increased density projecting over the right apex could be artifactual.  IMPRESSION: No convincing evidence of acute cardiopulmonary disease.  Vague increased density projecting over the right apex, possibly artifactual. Consider further evaluation with PA and lateral radiographs, which can be performed non emergently.    Electronically Signed   By: Abigail Miyamoto M.D.   On: 04/15/2015 15:42  Dg Hip Unilat With Pelvis 2-3 Views Right  04/15/2015   CLINICAL DATA:  [place] 3 weeks ago.  Right hip pain.  EXAM: DG HIP (WITH OR WITHOUT PELVIS) 2-3V RIGHT  COMPARISON:  None.  FINDINGS: The pelvic bony ring is intact. There is sclerosis at the right SI joint. Probable scoliosis in the lower lumbar spine. There is a displaced fracture of the proximal right femur. This is suggestive for a subcapital fracture with some impaction. The right femoral head is located.  IMPRESSION: Subcapital right femur fracture.   Electronically Signed   By: Markus Daft M.D.   On: 04/15/2015 15:45    EKG:   Orders placed or performed during the hospital encounter of 04/15/15  . ED EKG  . ED EKG  . EKG 12-Lead  . EKG 12-Lead    ASSESSMENT AND PLAN:   1) right femoral neck fracture:  - hopefully will have surgery later today - will need bisphosphonate in the future  2) hypertension - diet controlled, doing well  3) Graves disease - continue levothyroxine  4) Migraine - none at this time, continue propranolol and give sumatriptan  5) right lung opacity - repeat xray shows that this was bony overlap, no opacity  All the records are reviewed and case discussed with Care Management/Social Workerr. Management plans discussed with the patient, family and they are in agreement.  CODE STATUS: full  TOTAL TIME TAKING CARE OF THIS PATIENT: 25 minutes.  Greater than 50% of time spent in care coordination and counseling. POSSIBLE D/C IN 3-4 DAYS, DEPENDING ON CLINICAL CONDITION.   Myrtis Ser M.D on 04/16/2015 at 12:37 PM  Between 7am to 6pm - Pager - [phone]  After 6pm go to [email or web address] - password [email or web address] [facility]  Office  [phone]  CC: Primary care physician; Kirk Ruths., MD

## 2015-04-16 NOTE — Anesthesia Preprocedure Evaluation (Addendum)
Anesthesia Evaluation  Patient identified by MRN, date of birth, ID band Patient awake    Reviewed: Allergy & Precautions, NPO status , Patient's Chart, lab work & pertinent test results, reviewed documented beta blocker date and time   Airway Mallampati: II  TM Distance: >3 FB     Dental  (+) Chipped   Pulmonary          Cardiovascular hypertension, Pt. on medications and Pt. on home beta blockers     Neuro/Psych  Headaches,    GI/Hepatic   Endo/Other  Hyperthyroidism   Renal/GU      Musculoskeletal   Abdominal   Peds  Hematology   Anesthesia Other Findings   Reproductive/Obstetrics                            Anesthesia Physical Anesthesia Plan  ASA: II  Anesthesia Plan: Spinal   Post-op Pain Management:    Induction:   Airway Management Planned: Nasal Cannula  Additional Equipment:   Intra-op Plan:   Post-operative Plan:   Informed Consent: I have reviewed the patients History and Physical, chart, labs and discussed the procedure including the risks, benefits and alternatives for the proposed anesthesia with the patient or authorized representative who has indicated his/her understanding and acceptance.     Plan Discussed with: CRNA  Anesthesia Plan Comments:         Anesthesia Quick Evaluation

## 2015-04-16 NOTE — Care Management Note (Signed)
Case Management Note  Patient Details  Name: Diane Shea MRN: [id number] Date of Birth: Dec 11, 1950  Subjective/Objective:                  Met with patient to discuss discharge planning. Patient pending surgery today with Dr. Marry Guan. Patient states that she "received two doses of Heparin yesterday" when I asked her about history of Lovenox use. Hassan Rowan RN Charge states that Dr. Marry Guan is aware.. She has used [facility] in the past and wants the same therapist Vida Roller). She lives with her husband. She has a rolling walker and bedside commode. She denies need for shower chair. She uses [facility]  [phone] for Rx.   Action/Plan: Tim with [facility] notified. RNCM will continue to follow.   Expected Discharge Date:  04/20/15               Expected Discharge Plan:     In-House Referral:     Discharge planning Services  CM Consult  Post Acute Care Choice:  Home Health Choice offered to:  Patient  DME Arranged:    DME Agency:     HH Arranged:  PT HH Agency:  [facility]  Status of Service:  In process, will continue to follow  Medicare Important Message Given:    Date Medicare IM Given:    Medicare IM give by:    Date Additional Medicare IM Given:    Additional Medicare Important Message give by:     If discussed at [place] of Stay Meetings, dates discussed:    Additional Comments:  Marshell Garfinkel, RN 04/16/2015, 3:08 PM

## 2015-04-16 NOTE — Transfer of Care (Signed)
Immediate Anesthesia Transfer of Care Note  Patient: Diane Shea  Procedure(s) Performed: Procedure(s): CANNULATED HIP PINNING (Right)  Patient Location: PACU  Anesthesia Type:General  Level of Consciousness: awake, alert , oriented and patient cooperative  Airway & Oxygen Therapy: Patient Spontanous Breathing and Patient connected to face mask oxygen  Post-op Assessment: Report given to RN and Post -op Vital signs reviewed and stable  Post vital signs: Reviewed and stable  Last Vitals:  Filed Vitals:   04/16/15 2004  BP: 140/86  Pulse: 65  Temp: 36.2 C  Resp:     Complications: No apparent anesthesia complications

## 2015-04-16 NOTE — Brief Op Note (Signed)
04/15/2015 - 04/16/2015  8:12 PM  PATIENT:  Diane Shea  64 y.o. female  PRE-OPERATIVE DIAGNOSIS:  right femoral neck fracture  POST-OPERATIVE DIAGNOSIS:  right femoral neck fracture  PROCEDURE:  Procedure(s): CANNULATED HIP PINNING (Right)  SURGEON:  Surgeon(s) and Role:    * Dereck Leep, MD - Primary  ASSISTANTS: none   ANESTHESIA:   general  EBL:  Total I/O In: 1000 [I.V.:1000] Out: 225 [Urine:200; Blood:25]  BLOOD ADMINISTERED:none  DRAINS: none   LOCAL MEDICATIONS USED:  NONE  SPECIMEN:  No Specimen  DISPOSITION OF SPECIMEN:  N/A  COUNTS:  YES  TOURNIQUET:  * No tourniquets in log *  DICTATION: .Dragon Dictation  PLAN OF CARE: Admit to inpatient   PATIENT DISPOSITION:  PACU - hemodynamically stable.   Delay start of Pharmacological VTE agent (>24hrs) due to surgical blood loss or risk of bleeding: yes

## 2015-04-16 NOTE — Op Note (Signed)
OPERATIVE NOTE  DATE OF SURGERY: 04/16/2015  PATIENT NAME:  Diane Shea   DOB: [birth year]  MRN: [id number]   PRE-OPERATIVE DIAGNOSIS: Right femoral neck fracture  POST-OPERATIVE DIAGNOSIS:  Same  PROCEDURE:  Percutaneous pinning of a right femoral neck fracture  SURGEON:  Marciano Sequin. M.D.  ANESTHESIA: general  ESTIMATED BLOOD LOSS: 25 mL  FLUIDS REPLACED: 1000 mL of crystalloid  DRAINS: None  IMPLANTS UTILIZED: Synthes 7.3 mm cannulated partially threaded cancellous screws x3  INDICATIONS FOR SURGERY: Diane Shea is a 64 y.o. year old female who fell and sustained a right femoral neck fracture. After discussion of the risks and benefits of surgical intervention, the patient expressed understanding of the risks benefits and agree with plans for percutaneous pinning of the femoral neck fracture.   PROCEDURE IN DETAIL: The patient was brought into the operating room and, after adequate general anesthesia was achieved, the patient was positioned on the fracture table. The right lower extremity was positioned in traction and the contralateral leg was positioned in the well leg holder. All bony prominences were well-padded. The patient's right hip and leg were cleaned and prepped with alcohol and DuraPrep and draped in the usual sterile fashion. A "timeout" was performed as per usual protocol. A stab incision was made along the lateral aspect of the left hip and a distally threaded guidepin was provisionally placed along the lateral cortex of the femur. Position was confirmed both AP and lateral planes. The guidewire was then advanced into the femoral neck and head in anticipation of placement of the inferior screw. Again, position was confirmed both AP and lateral planes. The parallel wire guide was placed over the initial guidewire and a distally threaded guidewire was inserted through the superior/anterior position. Position was confirmed in both AP and lateral planes  using the C-arm. Finally, a third distally threaded guidewire was inserted through the superior/posterior position. Final position of the guide pins was confirmed in both AP and lateral planes using the C-arm. Measurements were obtained and three Synthes 7.3 mm partially threaded cannulated cancellus screws were advanced over the guidewires. Placement and position were confirmed in both AP and lateral planes using the C-arm. The guidewires were removed. The wound was irrigated with copious amounts of normal saline with antibiotic solution. Subcutaneous tissue was approximated in layers using first #0 Vicryl followed by #2-0 Vicryl. Skin was closed with skin staples. A sterile dressing was applied.  The patient tolerated the procedure well. She was transported to the recovery room in stable condition.  James P. Holley Bouche M.D.

## 2015-04-16 NOTE — Anesthesia Procedure Notes (Signed)
Procedure Name: LMA Insertion Date/Time: 04/16/2015 6:05 PM Performed by: Rolla Plate Pre-anesthesia Checklist: Patient identified, Patient being monitored, Timeout performed, Emergency Drugs available and Suction available Patient Re-evaluated:Patient Re-evaluated prior to inductionOxygen Delivery Method: Circle system utilized Preoxygenation: Pre-oxygenation with 100% oxygen Intubation Type: IV induction LMA: LMA inserted LMA Size: 3.5 Tube type: Oral Number of attempts: 1 Placement Confirmation: positive ETCO2 and breath sounds checked- equal and bilateral Tube secured with: Tape Dental Injury: Teeth and Oropharynx as per pre-operative assessment

## 2015-04-16 NOTE — Consult Note (Signed)
ORTHOPAEDIC CONSULTATION  PATIENT NAME: Diane Shea DOB: July 03, 1951  MRN: [id number]  REQUESTING PHYSICIAN: Aldean Jewett, MD  Chief Complaint: Right hip pain  HPI: Diane Shea is a 64 y.o. female who complains of  right hip pain. Proximally 3 weeks ago she fell while walking her dog and landed on her right hip and side. She noted the onset of some right hip and groin pain but was still able to ambulate. She attempted to limit her activities but had continued right hip discomfort. She denied any other injuries. She presented to her PCP yesterday for a routine checkup and radiographs of the right hip demonstrated a right femoral neck fracture.  Past Medical History  Diagnosis Date  . Cancer 2003    Breast  . Graves disease   . Migraines   . Hypertension 2006  . Encounter for screening colonoscopy 04/15/2013   Past Surgical History  Procedure Laterality Date  . 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 Breast lumpectomy Left 2003  . Breast biopsy Left 2003    sentinel node  . Breast surgery Left 2003    mammosite placement  . Colonoscopy  2004  . Abdominal hysterectomy  2004  . Foot surgery    . Replacement total knee Left 2013   Social History   Social History  . Marital Status: Married    Spouse Name: N/A  . Number of Children: N/A  . Years of Education: N/A   Social History Main Topics  . Smoking status: Never Smoker   . Smokeless tobacco: Never Used  . Alcohol Use: No  . Drug Use: No  . Sexual Activity: Not Asked   Other Topics Concern  . None   Social History Narrative   Family History  Problem Relation Age of Onset  . CAD Father   . Prostate cancer Father    No Known Allergies Prior to Admission medications   Medication Sig Start Date End Date Taking? Authorizing Provider  ALPHA LIPOIC ACID PO Take 1 capsule by mouth at bedtime.    Yes Historical Provider, MD  calcium carbonate (OS-CAL) 600 MG TABS tablet Take 600 mg by mouth 2 (two)  times daily.    Yes Historical Provider, MD  cholecalciferol (VITAMIN D) 1000 UNITS tablet Take 1,000 Units by mouth daily.   Yes Historical Provider, MD  gabapentin (NEURONTIN) 300 MG capsule Take 600 mg by mouth at bedtime.   Yes Historical Provider, MD  levothyroxine (SYNTHROID, LEVOTHROID) 137 MCG tablet Take 1 tablet by mouth daily.   Yes Historical Provider, MD  Multiple Vitamins-Minerals (MULTIVITAMIN WITH MINERALS) tablet Take 1 tablet by mouth daily.   Yes Historical Provider, MD  propranolol ER (INDERAL LA) 60 MG 24 hr capsule Take 1 capsule by mouth at bedtime.    Yes Historical Provider, MD  SUMAtriptan (IMITREX) 100 MG tablet Take 1 tablet by mouth every 2 (two) hours as needed for migraine.    Yes Historical Provider, MD  polyethylene glycol powder (GLYCOLAX/MIRALAX) powder 255 grams one bottle for colonoscopy prep Patient not taking: Reported on 04/15/2015 04/15/13   Robert Bellow, MD   Positive ROS: All other systems have been reviewed and were otherwise negative with the exception of those mentioned in the HPI and as above.  Physical Exam: General: Alert and alert in no acute distress. HEENT: Atraumatic and normocephalic. Sclera are clear. Extraocular motion is intact. Oropharynx is clear with moist mucosa. Neck: Supple, nontender, good range of  motion. No JVD or carotid bruits. Lungs: Clear to auscultation bilaterally. Cardiovascular: Regular rate and rhythm with normal S1 and S2. No murmurs. No gallops or rubs. Pedal pulses are palpable bilaterally. Homans test is negative bilaterally. No significant pretibial or ankle edema. Abdomen: Soft, nontender, and nondistended. Bowel sounds are present. Skin: No lesions in the area of chief complaint Neurologic: Awake, alert, and oriented. Sensory function is grossly intact. Motor strength is felt to be 5 over 5 bilaterally. No clonus or tremor. Good motor coordination. Lymphatic: No axillary or cervical  lymphadenopathy  MUSCULOSKELETAL: Examination of the right lower extremity was performed. No erythema or ecchymosis about the right hip or thigh. No gross swelling. There is some discomfort with range of motion of the right hip. No significant shortening or gross external rotation of the lower extremities appreciated. A well-healed surgical incision is noted to the right knee consistent with a right total knee arthroplasty.  Radiographs: AP pelvis, AP and lateral radiographs of the right hip from [facility] performed on 04/15/2015 were reviewed. There is an impacted right femoral neck fracture. Good alignment is noted on the lateral view. Good preservation of the cartilage space.  Assessment: Right femoral neck fracture  Plan: The findings were discussed in detail with the patient and her husband. Radiographs are consistent with a Garden 1 fracture pattern. I have recommended percutaneous pinning of the femoral neck fracture. We discussed risks and benefits of surgical intervention. The usual perioperative course was also discussed. The possibility of a nonunion or avascular necrosis was discussed. However, I believe the fracture pattern lends itself to percutaneous pinning with anticipated good results. The patient expressed understanding of the risks benefits and agree with plans for surgical intervention. The right hip was signed as per her right site surgery protocol.  James P. Holley Bouche M.D.

## 2015-04-17 ENCOUNTER — Encounter: Payer: Self-pay | Admitting: Orthopedic Surgery

## 2015-04-17 LAB — CBC
HCT: 38 % (ref 35.0–47.0)
HEMOGLOBIN: 12.6 g/dL (ref 12.0–16.0)
MCH: 30.4 pg (ref 26.0–34.0)
MCHC: 33.1 g/dL (ref 32.0–36.0)
MCV: 91.8 fL (ref 80.0–100.0)
PLATELETS: 222 10*3/uL (ref 150–440)
RBC: 4.14 MIL/uL (ref 3.80–5.20)
RDW: 14.2 % (ref 11.5–14.5)
WBC: 6.5 10*3/uL (ref 3.6–11.0)

## 2015-04-17 LAB — BASIC METABOLIC PANEL
ANION GAP: 5 (ref 5–15)
BUN: 11 mg/dL (ref 6–20)
CALCIUM: 8.5 mg/dL — AB (ref 8.9–10.3)
CHLORIDE: 106 mmol/L (ref 101–111)
CO2: 26 mmol/L (ref 22–32)
Creatinine, Ser: 0.57 mg/dL (ref 0.44–1.00)
GFR calc non Af Amer: >60 mL/min (ref >60)
Glucose, Bld: 124 mg/dL — ABNORMAL HIGH (ref 65–99)
Potassium: 4 mmol/L (ref 3.5–5.1)
Sodium: 137 mmol/L (ref 135–145)

## 2015-04-17 MED ORDER — ENOXAPARIN SODIUM 40 MG/0.4ML ~~LOC~~ SOLN
40.0000 mg | SUBCUTANEOUS | Status: DC
  Administered 2015-04-18: 40 mg via SUBCUTANEOUS
  Filled 2015-04-17: qty 0.4

## 2015-04-17 NOTE — Progress Notes (Signed)
Physical Therapy Treatment Patient Details Name: Diane Shea MRN: [id number] DOB: Sep 21, 1950 Today's Date: 04/17/2015    History of Present Illness R hip ORIF    PT Comments    Pt is able to circumambulate the nurses' station this afternoon and generally does quite well.  She continues to have very little pain and is highly motivated to work with PT. She is showing increased hip flexion strength and needs only CGA with transfers/mobility.  Follow Up Recommendations  Home health PT (pt did much better this PM, should be able to go home)     Equipment Recommendations       Recommendations for Other Services       Precautions / Restrictions Precautions Precautions: Fall Restrictions Weight Bearing Restrictions: Yes RLE Weight Bearing: Partial weight bearing    Mobility  Bed Mobility Overal bed mobility: Needs Assistance Bed Mobility: Sit to Supine       Sit to supine: Min guard      Transfers Overall transfer level: Needs assistance Equipment used: Rolling walker (2 wheeled)   Sit to Stand: Min guard         General transfer comment: Pt rises with much more confidence this afternoon  Ambulation/Gait Ambulation/Gait assistance: Min guard Ambulation Distance (Feet): 200 Feet Assistive device: Rolling walker (2 wheeled)       General Gait Details: with PWB pt is able to ambulate around the nurses' station with good confidence and consistent gait.  She reports no real increased pain with the effort and has little fatigue.  Pt did very well.   Stairs            Wheelchair Mobility    Modified Rankin (Stroke Patients Only)       Balance                                    Cognition Arousal/Alertness: Awake/alert Behavior During Therapy: WFL for tasks assessed/performed Overall Cognitive Status: Within Functional Limits for tasks assessed                      Exercises General Exercises - Lower Extremity Ankle  Circles/Pumps: AROM;10 reps Quad Sets: AROM;10 reps Gluteal Sets: AROM;10 reps Short Arc Quad: Strengthening;10 reps Heel Slides: AROM;10 reps Hip ABduction/ADduction: 10 reps;AROM Straight Leg Raises: AROM;10 reps    General Comments        Pertinent Vitals/Pain Pain Assessment: 0-10 Pain Score: 3     Home Living Family/patient expects to be discharged to:: Private residence       Home Access: Stairs to enter            Prior Function Level of Independence: Independent          PT Goals (current goals can now be found in the care plan section) Acute Rehab PT Goals Patient Stated Goal: To go home Progress towards PT goals: Progressing toward goals    Frequency  BID    PT Plan Current plan remains appropriate    Co-evaluation             End of Session Equipment Utilized During Treatment: Gait belt Activity Tolerance: Patient tolerated treatment well Patient left: with bed alarm set     Time: 4709-6283 PT Time Calculation (min) (ACUTE ONLY): 26 min  Charges:  $Gait Training: 8-22 mins $Therapeutic Exercise: 8-22 mins  G Codes:     Diane Shea, PT, DPT [phone]  Diane Shea 04/17/2015, 5:23 PM

## 2015-04-17 NOTE — Evaluation (Signed)
Physical Therapy Evaluation Patient Details Name: Diane Shea MRN: [id number] DOB: 08/13/1951 Today's Date: 04/17/2015   History of Present Illness  Pt is here with R hip fx, ORIF  Clinical Impression  Pt does fairly well with PT showing good ability to maintain minimal weight through the R LE using walker/UEs appropriately.  She does well with 10 minutes of exercises apart from PT exam.     Follow Up Recommendations Home health PT;SNF (per progress)    Equipment Recommendations       Recommendations for Other Services       Precautions / Restrictions Precautions Precautions: Fall Restrictions Weight Bearing Restrictions: Yes RLE Weight Bearing: Touchdown weight bearing Other Position/Activity Restrictions: order stays WBAT, pt reports surgeon said toe-touch only?      Mobility  Bed Mobility Overal bed mobility: Needs Assistance Bed Mobility: Supine to Sit     Supine to sit: Min assist        Transfers Overall transfer level: Needs assistance Equipment used: Rolling walker (2 wheeled) Transfers: Sit to/from Stand Sit to Stand: Min assist            Ambulation/Gait Ambulation/Gait assistance: Min assist Ambulation Distance (Feet): 40 Feet Assistive device: Rolling walker (2 wheeled)       General Gait Details: Pt is able to maintain essentially NWBing status during ambulation and though her UEs fatigue a little she is able to do well and shows good safety with mobiltiy.  Stairs            Wheelchair Mobility    Modified Rankin (Stroke Patients Only)       Balance                                             Pertinent Vitals/Pain Pain Assessment: 0-10 Pain Score: 6     Home Living Family/patient expects to be discharged to:: Private residence Living Arrangements: Spouse/significant other     Home Access: Stairs to enter   Technical brewer of Steps: 4          Prior Function Level of  Independence: Independent               Hand Dominance        Extremity/Trunk Assessment   Upper Extremity Assessment: Overall WFL for tasks assessed           Lower Extremity Assessment: RLE deficits/detail (expected post op weakness, L LE functional)         Communication   Communication: No difficulties  Cognition Arousal/Alertness: Awake/alert Behavior During Therapy: WFL for tasks assessed/performed Overall Cognitive Status: Within Functional Limits for tasks assessed                      General Comments      Exercises General Exercises - Lower Extremity Ankle Circles/Pumps: AROM;10 reps Quad Sets: AROM;10 reps Gluteal Sets: AROM;10 reps Heel Slides: AROM;10 reps (no pressre with hip extension on the R) Hip ABduction/ADduction: 10 reps;AROM      Assessment/Plan    PT Assessment Patient needs continued PT services  PT Diagnosis Difficulty walking;Generalized weakness   PT Problem List Decreased strength;Decreased range of motion;Decreased activity tolerance;Decreased mobility;Decreased balance;Decreased coordination;Pain  PT Treatment Interventions Gait training;Therapeutic activities;Therapeutic exercise;DME instruction;Stair training;Balance training;Neuromuscular re-education   PT Goals (Current goals can be found in the Care Plan section) Acute  Rehab PT Goals PT Goal Formulation: With patient Time For Goal Achievement: 05/01/15 Potential to Achieve Goals: Good    Frequency BID   Barriers to discharge        Co-evaluation               End of Session Equipment Utilized During Treatment: Gait belt Activity Tolerance: Patient tolerated treatment well Patient left: with chair alarm set           Time: 6389-3734 PT Time Calculation (min) (ACUTE ONLY): 36 min   Charges:   PT Evaluation $Initial PT Evaluation Tier I: 1 Procedure PT Treatments $Therapeutic Exercise: 8-22 mins   PT G Codes:       Wayne Both, PT,  DPT [phone]  Kreg Shropshire 04/17/2015, 1:21 PM

## 2015-04-17 NOTE — Progress Notes (Signed)
  Subjective: 1 Day Post-Op Procedure(s) (LRB): CANNULATED HIP PINNING (Right) Patient reports pain as mild.  She slept well last night and seems comfortable. Patient seen in rounds with Dr. Marry Guan. Patient is well, and has had no acute complaints or problems Plan is to go Home after hospital stay. Negative for chest pain and shortness of breath Fever: no Gastrointestinal: Negative for nausea and vomiting  Objective: Vital signs in last 24 hours: Temp:  [97.1 F (36.2 C)-98 F (36.7 C)] 97.4 F (36.3 C) (08/12 0430) Pulse Rate:  [45-78] 46 (08/12 0430) Resp:  [9-19] 18 (08/12 0430) BP: (84-160)/(52-89) 97/65 mmHg (08/12 0430) SpO2:  [95 %-100 %] 98 % (08/12 0430)  Intake/Output from previous day:  Intake/Output Summary (Last 24 hours) at 04/17/15 0547 Last data filed at 04/17/15 0516  Gross per 24 hour  Intake   2790 ml  Output    425 ml  Net   2365 ml    Intake/Output this shift: Total I/O In: 1968.3 [I.V.:1968.3] Out: 425 [Urine:400; Blood:25]  Labs:  Recent Labs  04/15/15 1515 04/16/15 0612 04/17/15 0416  HGB 12.8 13.8 12.6    Recent Labs  04/16/15 0612 04/17/15 0416  WBC 4.6 6.5  RBC 4.49 4.14  HCT 41.3 38.0  PLT 254 222    Recent Labs  04/16/15 0612 04/17/15 0416  NA 141 137  K 3.9 4.0  CL 103 106  CO2 28 26  BUN 15 11  CREATININE 0.67 0.57  GLUCOSE 91 124*  CALCIUM 9.2 8.5*   No results for input(s): LABPT, INR in the last 72 hours.   EXAM General - Patient is Alert and Oriented Extremity - Neurovascular intact Sensation intact distally Dressing/Incision - clean, dry, no drainage Motor Function - intact, moving foot and toes well on exam.   Past Medical History  Diagnosis Date  . Cancer 2003    Breast  . Graves disease   . Migraines   . Hypertension 2006  . Encounter for screening colonoscopy 04/15/2013    Assessment/Plan: 1 Day Post-Op Procedure(s) (LRB): CANNULATED HIP PINNING (Right) Principal Problem:   Hip  fracture, right Active Problems:   Hip fracture  Estimated body mass index is 30.48 kg/(m^2) as calculated from the following:   Height as of this encounter: 5\' 3"  (1.6 m).   Weight as of this encounter: 78.019 kg (172 lb). Advance diet Up with therapy  DVT Prophylaxis - Lovenox, Foot Pumps and TED hose Weight-Bearing as tolerated to right leg  Reche Dixon, PA-C Orthopaedic Surgery 04/17/2015, 5:47 AM

## 2015-04-17 NOTE — Progress Notes (Signed)
[facility] [place] NAME: Diane Shea    MR#:  [id number]  DATE OF BIRTH:  11/20/1950  SUBJECTIVE:  CHIEF COMPLAINT:   Chief Complaint  Patient presents with  . Hip Pain   Doing well, pain is controlled. No new complaints, surgery went well.  REVIEW OF SYSTEMS:   Review of Systems  Constitutional: Negative for fever.  Respiratory: Negative for shortness of breath.   Cardiovascular: Negative for chest pain and palpitations.  Gastrointestinal: Negative for nausea, vomiting and abdominal pain.  Genitourinary: Negative for dysuria.    DRUG ALLERGIES:  No Known Allergies  VITALS:  Blood pressure 120/55, pulse 50, temperature 98.5 F (36.9 C), temperature source Oral, resp. rate 17, height 5\' 3"  (1.6 m), weight 78.019 kg (172 lb), SpO2 99 %.  PHYSICAL EXAMINATION:  GENERAL:  64 y.o.-year-old patient lying in the bed with no acute distress.  LUNGS: Normal breath sounds bilaterally, no wheezing, rales,rhonchi or crepitation. No use of accessory muscles of respiration.  CARDIOVASCULAR: S1, S2 normal. No murmurs, rubs, or gallops.  ABDOMEN: Soft, nontender, nondistended. Bowel sounds present. No organomegaly or mass.  EXTREMITIES: No pedal edema, cyanosis, or clubbing. Foot pumps in place, dressing dry PSYCHIATRIC: The patient is alert and oriented x 3.  SKIN: No obvious rash, lesion, or ulcer.    LABORATORY PANEL:   CBC  Recent Labs Lab 04/17/15 0416  WBC 6.5  HGB 12.6  HCT 38.0  PLT 222   ------------------------------------------------------------------------------------------------------------------  Chemistries   Recent Labs Lab 04/17/15 0416  NA 137  K 4.0  CL 106  CO2 26  GLUCOSE 124*  BUN 11  CREATININE 0.57  CALCIUM 8.5*   ------------------------------------------------------------------------------------------------------------------  Cardiac Enzymes No results for input(s): TROPONINI in the  last 168 hours. ------------------------------------------------------------------------------------------------------------------  RADIOLOGY:  Dg Chest 2 View  04/16/2015   CLINICAL DATA:  Pre operative respiratory exam. Hip fracture. Abnormal portable radiograph.  EXAM: CHEST  2 VIEW  COMPARISON:  04/15/2015  FINDINGS: The heart size and pulmonary vascularity are normal. The lungs are clear. Benign calcification in the left breast. No osseous abnormality. The density seen at the right apex on the prior study was a confluence of osseous structures.  IMPRESSION: No active cardiopulmonary disease.   Electronically Signed   By: Lorriane Shire M.D.   On: 04/16/2015 07:32   Dg Chest Port 1 View  04/15/2015   CLINICAL DATA:  Fall 3 weeks ago.  Hip fracture.  Preoperative.  EXAM: PORTABLE CHEST - 1 VIEW  COMPARISON:  Report of a a 07/30/2002 chest radiograph.  FINDINGS: Apical lordotic positioning. Midline trachea. Normal heart size. Mildly tortuous descending thoracic aorta. No pleural effusion or pneumothorax. Surgical clips within in the left breast or left axilla. Probable scarring at the left lung base ; partial obscuration of left hemidiaphragm laterally. Vague increased density projecting over the right apex could be artifactual.  IMPRESSION: No convincing evidence of acute cardiopulmonary disease.  Vague increased density projecting over the right apex, possibly artifactual. Consider further evaluation with PA and lateral radiographs, which can be performed non emergently.   Electronically Signed   By: Abigail Miyamoto M.D.   On: 04/15/2015 15:42   Dg Hip Operative Unilat With Pelvis Right  04/16/2015   CLINICAL DATA:  ORIF of a right subcapital femoral neck fracture.  EXAM: OPERATIVE RIGHT HIP (WITH PELVIS IF PERFORMED) 2 VIEWS  TECHNIQUE: Fluoroscopic spot image(s) were submitted for interpretation post-operatively.  COMPARISON:  04/15/2015  FINDINGS: Images show the insertion of 3 screws across the  subcapital femoral neck fracture reducing the fracture fragments into near anatomic alignment.  There is no new fracture or operative complication.  IMPRESSION: Well-aligned subcapital right femoral neck fracture following ORIF.   Electronically Signed   By: Lajean Manes M.D.   On: 04/16/2015 20:00   Dg Hip Unilat With Pelvis 2-3 Views Right  04/15/2015   CLINICAL DATA:  [place] 3 weeks ago.  Right hip pain.  EXAM: DG HIP (WITH OR WITHOUT PELVIS) 2-3V RIGHT  COMPARISON:  None.  FINDINGS: The pelvic bony ring is intact. There is sclerosis at the right SI joint. Probable scoliosis in the lower lumbar spine. There is a displaced fracture of the proximal right femur. This is suggestive for a subcapital fracture with some impaction. The right femoral head is located.  IMPRESSION: Subcapital right femur fracture.   Electronically Signed   By: Markus Daft M.D.   On: 04/15/2015 15:45    EKG:   Orders placed or performed during the hospital encounter of 04/15/15  . EKG 12-Lead  . EKG 12-Lead    ASSESSMENT AND PLAN:   1) right femoral neck fracture:  - post op day 1, doing well - will need bisphosphonate in the future - likely will be able to go home with PT rather than to SNF  2) hypertension - diet controlled, doing well  3) Graves disease - continue levothyroxine  4) Migraine - none at this time, continue propranolol and give sumatriptan  5) right lung opacity - repeat xray shows that this was bony overlap, no opacity  All the records are reviewed and case discussed with Care Management/Social Workerr. Management plans discussed with the patient, family and they are in agreement.  CODE STATUS: full  TOTAL TIME TAKING CARE OF THIS PATIENT: 25 minutes.  Greater than 50% of time spent in care coordination and counseling. POSSIBLE D/C IN 3-4 DAYS, DEPENDING ON CLINICAL CONDITION.   Myrtis Ser M.D on 04/17/2015 at 1:27 PM  Between 7am to 6pm - Pager - [phone]  After 6pm go to  [email or web address] - password [email or web address] [facility]  Office  [phone]  CC: Primary care physician; Kirk Ruths., MD

## 2015-04-17 NOTE — Progress Notes (Signed)
Leave foley in until later today per Reche Dixon

## 2015-04-17 NOTE — Evaluation (Signed)
Occupational Therapy Evaluation Patient Details Name: Diane Shea MRN: [id number] DOB: [birth year] Today's Date: 04/17/2015    History of Present Illness This patient is a 64 year old female who came to [facility] after a fall fratureing her right femur.  She recieved an ORIF (nailing) repair.    Clinical Impression   This patient is a 64 year old female who came to [facility] after a fall fratureing her right femur.  She recieved an ORIF (nailing) repair. She lives with her husband in a 1 story home with 4 steps to enter and a single rail. She had been independent with activities of daily living and functional mobility with out use of assistive device. 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 She now needs some assist and would benefit from Occupational Therapy for ADL/functioal mobility training.    Follow Up Recommendations       Equipment Recommendations       Recommendations for Other Services       Precautions / Restrictions Precautions Precautions: Fall Restrictions Weight Bearing Restrictions: Yes RLE Weight Bearing: Touchdown weight bearing Other Position/Activity Restrictions: order stays WBAT, pt reports surgeon said toe-touch only?      Mobility Bed Mobility              Balance                                            ADL                                         General ADL Comments: Patient had been independent with ADL and functional mobility. Patient reviewed hip kit, but then demonstrated ability to Donned/doffed socks and pants to knees (patient did not want to fully dress until she urinates) with out assistive devices and with verbal cues only.      Vision     Perception     Praxis      Pertinent Vitals/Pain Pain Assessment: 0-10 Pain Score: 0-No pain (0 at rest, 1 with minimal movement)     Hand Dominance     Extremity/Trunk Assessment Upper Extremity Assessment Upper Extremity Assessment:  (B UE 5/5)    Lower Extremity Assessment Lower Extremity Assessment: Defer to PT evaluation       Communication Communication Communication: No difficulties   Cognition Arousal/Alertness: Awake/alert Behavior During Therapy: WFL for tasks assessed/performed Overall Cognitive Status: Within Functional Limits for tasks assessed                     General Comments       Exercises Exercises: General Lower Extremity     Shoulder Instructions      Home Living Family/patient expects to be discharged to:: Private residence Living Arrangements: Spouse/significant other     Home Access: Stairs to enter Technical brewer of Steps: 4                              Prior Functioning/Environment Level of Independence: Independent             OT Diagnosis: Acute pain (minimal)   OT Problem List: Pain   OT Treatment/Interventions: Self-care/ADL training    OT Goals(Current goals can be  found in the care plan section) Acute Rehab OT Goals Patient Stated Goal: To go home Time For Goal Achievement: 05/01/15 Potential to Achieve Goals: Good  OT Frequency: Min 1X/week   Barriers to D/C:            Co-evaluation              End of Session Equipment Utilized During Treatment:  (Educated in hip kit but not needed.)  Activity Tolerance: Patient tolerated treatment well Patient left: in chair;with call bell/phone within reach;with chair alarm set   Time: 1327-1350 OT Time Calculation (min): 23 min Charges:  OT General Charges $OT Visit: 1 Procedure OT Evaluation $Initial OT Evaluation Tier I: 1 Procedure OT Treatments $Self Care/Home Management : 8-22 mins G-Codes:    Myrene Galas, MS/OTR/L  04/17/2015, 1:56 PM

## 2015-04-17 NOTE — Care Management Important Message (Signed)
Important Message  Patient Details  Name: Diane Shea MRN: [id number] Date of Birth: 30-Sep-1950   Medicare Important Message Given:  Yes-second notification given    Juliann Pulse A Allmond 04/17/2015, 1:00 PM

## 2015-04-17 NOTE — Anesthesia Postprocedure Evaluation (Signed)
  Anesthesia Post-op Note  Patient: Diane Shea  Procedure(s) Performed: Procedure(s): CANNULATED HIP PINNING (Right)  Anesthesia type:Spinal  Patient location: PACU  Post pain: Pain level controlled  Post assessment: Post-op Vital signs reviewed, Patient's Cardiovascular Status Stable, Respiratory Function Stable, Patent Airway and No signs of Nausea or vomiting  Post vital signs: Reviewed and stable  Last Vitals:  Filed Vitals:   04/17/15 0430  BP: 97/65  Pulse: 46  Temp: 36.3 C  Resp: 18    Level of consciousness: awake, alert  and patient cooperative  Complications: No apparent anesthesia complications

## 2015-04-18 LAB — CBC
HCT: 35.3 % (ref 35.0–47.0)
Hemoglobin: 12.1 g/dL (ref 12.0–16.0)
MCH: 31.5 pg (ref 26.0–34.0)
MCHC: 34.2 g/dL (ref 32.0–36.0)
MCV: 92.2 fL (ref 80.0–100.0)
PLATELETS: 211 10*3/uL (ref 150–440)
RBC: 3.83 MIL/uL (ref 3.80–5.20)
RDW: 14.4 % (ref 11.5–14.5)
WBC: 6.1 10*3/uL (ref 3.6–11.0)

## 2015-04-18 LAB — BASIC METABOLIC PANEL
Anion gap: 6 (ref 5–15)
BUN: 10 mg/dL (ref 6–20)
CALCIUM: 8.6 mg/dL — AB (ref 8.9–10.3)
CO2: 27 mmol/L (ref 22–32)
Chloride: 109 mmol/L (ref 101–111)
Creatinine, Ser: 0.49 mg/dL (ref 0.44–1.00)
GFR calc Af Amer: >60 mL/min (ref >60)
GFR calc non Af Amer: >60 mL/min (ref >60)
Glucose, Bld: 101 mg/dL — ABNORMAL HIGH (ref 65–99)
POTASSIUM: 3.6 mmol/L (ref 3.5–5.1)
Sodium: 142 mmol/L (ref 135–145)

## 2015-04-18 MED ORDER — ENOXAPARIN SODIUM 40 MG/0.4ML ~~LOC~~ SOLN: 40.0000 mg | SUBCUTANEOUS | Status: DC

## 2015-04-18 MED ORDER — OXYCODONE HCL 5 MG PO TABS: 5.0000 mg | ORAL_TABLET | ORAL | Status: DC | PRN

## 2015-04-18 NOTE — Progress Notes (Signed)
Physical Therapy Treatment Patient Details Name: Diane Shea MRN: [id number] DOB: 04-23-1951 Today's Date: 04/18/2015    History of Present Illness R hip ORIF    PT Comments    Pt does very well with PT today and continues to show great attitude and effort.  She easily circumambulates the nurses' station as well as negotiates up/down 4 steps with single rail and only supervision and VCs.  She has essentially no pain and is showing increased LE strength with exercises.   Follow Up Recommendations  Home health PT   Equipment Recommendations       Recommendations for Other Services       Precautions / Restrictions Precautions Precautions: Fall Restrictions Weight Bearing Restrictions: Yes RLE Weight Bearing: Partial weight bearing    Mobility  Bed Mobility Overal bed mobility: Modified Independent Bed Mobility: Sit to Supine     Supine to sit: Modified independent (Device/Increase time)        Transfers Overall transfer level: Needs assistance Equipment used: Rolling walker (2 wheeled)   Sit to Stand: Min guard         General transfer comment: Pt rises with much more confidence this afternoon  Ambulation/Gait Ambulation/Gait assistance: Supervision Ambulation Distance (Feet): 250 Feet Assistive device: Rolling walker (2 wheeled)       General Gait Details: with PWB pt is able to ambulate around the nurses' station with good confidence and consistent gait.  She reports no pain with the effort and has little fatigue.  Pt did very well and was able to negotiate up/down steps w/o issue as well.    Stairs Stairs: Yes Stairs assistance: Supervision Stair Management: One rail Left Number of Stairs: 4 General stair comments: Pt does well with single UE stair negotiation and appears to be able to maintain Rachel well and w/o safety issues  Wheelchair Mobility    Modified Rankin (Stroke Patients Only)       Balance                                     Cognition Arousal/Alertness: Awake/alert Behavior During Therapy: WFL for tasks assessed/performed Overall Cognitive Status: Within Functional Limits for tasks assessed                      Exercises Total Joint Exercises General Exercises - Lower Extremity Ankle Circles/Pumps: AROM;10 reps Quad Sets: AROM;10 reps Gluteal Sets: AROM;10 reps Long Arc Quad: AROM;10 reps Heel Slides: AROM;10 reps Hip ABduction/ADduction: 10 reps;AROM Straight Leg Raises: AROM;10 reps    General Comments        Pertinent Vitals/Pain Pain Assessment: 0-10 Pain Score: 1     Home Living                      Prior Function            PT Goals (current goals can now be found in the care plan section) Progress towards PT goals: Progressing toward goals    Frequency  BID    PT Plan Current plan remains appropriate    Co-evaluation             End of Session Equipment Utilized During Treatment: Gait belt Activity Tolerance: Patient tolerated treatment well Patient left: with bed alarm set     Time: 0160-1093 PT Time Calculation (min) (ACUTE ONLY): 29 min  Charges:  $Gait Training:  8-22 mins $Therapeutic Exercise: 8-22 mins                    G Codes:     Wayne Both, [place], DPT [phone]  Kreg Shropshire 04/18/2015, 10:56 AM

## 2015-04-18 NOTE — Discharge Instructions (Signed)
Diet: As you were doing prior to hospitalization   Shower:  May shower but keep the wounds dry, use an occlusive plastic wrap, NO SOAKING IN TUB.  Don't get the bandage wet.  Dressing:  You may change your dressing as needed. Change the dressing with sterile gauze dressing.    Activity:  Increase activity slowly as tolerated, but follow the weight bearing instructions below.  No lifting or driving for 6 weeks.  Weight Bearing:   Weight bearing as tolerated to right lower extremity  To prevent constipation: you may use a stool softener such as -  Colace (over the counter) 100 mg by mouth twice a day  Drink plenty of fluids (prune juice may be helpful) and high fiber foods Miralax (over the counter) for constipation as needed.    Itching:  If you experience itching with your medications, try taking only a single pain pill, or even half a pain pill at a time.  You may take up to 10 pain pills per day, and you can also use benadryl over the counter for itching or also to help with sleep.   Precautions:  If you experience chest pain or shortness of breath - call 911 immediately for transfer to the hospital emergency department!!  If you develop a fever greater that 101 F, purulent drainage from wound, increased redness or drainage from wound, or calf pain-Call [place]                                              Follow- Up Appointment:  Please call for an appointment to be seen in 2 weeks at [facility]

## 2015-04-18 NOTE — Progress Notes (Signed)
Pt. D/c'd to home. D/C orders reviewed with pt. Lovenox and oxycodone scripts given to pt. SL removed and pt. Has no further questions. Left via wheelchair.

## 2015-04-18 NOTE — Progress Notes (Signed)
  Subjective: 2 Days Post-Op Procedure(s) (LRB): CANNULATED HIP PINNING (Right) Patient reports pain as 2 on 0-10 scale.   Patient is well, and has had no acute complaints or problems Plan is to go home with home health after hospital stay. Negative for chest pain and shortness of breath Fever: no Gastrointestinal:Negative for nausea and vomiting  Objective: Vital signs in last 24 hours: Temp:  [97.8 F (36.6 C)-98.4 F (36.9 C)] 97.8 F (36.6 C) (08/13 0538) Pulse Rate:  [52-62] 61 (08/13 0538) Resp:  [16-20] 16 (08/13 0538) BP: (107-138)/(55-78) 138/78 mmHg (08/13 0538) SpO2:  [98 %-100 %] 98 % (08/13 0538)  Intake/Output from previous day:  Intake/Output Summary (Last 24 hours) at 04/18/15 0909 Last data filed at 04/17/15 1724  Gross per 24 hour  Intake 1803.33 ml  Output    725 ml  Net 1078.33 ml    Intake/Output this shift:    Labs:  Recent Labs  04/15/15 1515 04/16/15 0612 04/17/15 0416 04/18/15 0423  HGB 12.8 13.8 12.6 12.1    Recent Labs  04/17/15 0416 04/18/15 0423  WBC 6.5 6.1  RBC 4.14 3.83  HCT 38.0 35.3  PLT 222 211    Recent Labs  04/17/15 0416 04/18/15 0423  NA 137 142  K 4.0 3.6  CL 106 109  CO2 26 27  BUN 11 10  CREATININE 0.57 0.49  GLUCOSE 124* 101*  CALCIUM 8.5* 8.6*   No results for input(s): LABPT, INR in the last 72 hours.   EXAM General - Patient is Alert, Appropriate and Oriented Extremity - Neurologically intact ABD soft Neurovascular intact Sensation intact distally Dorsiflexion/Plantar flexion intact Incision: dressing C/D/I No cellulitis present Dressing/Incision - clean, dry, no drainage Motor Function - intact, moving foot and toes well on exam.   Past Medical History  Diagnosis Date  . Cancer 2003    Breast  . Graves disease   . Migraines   . Hypertension 2006  . Encounter for screening colonoscopy 04/15/2013    Assessment/Plan: 2 Days Post-Op Procedure(s) (LRB): CANNULATED HIP PINNING  (Right) Principal Problem:   Hip fracture, right Active Problems:   Hip fracture  Estimated body mass index is 30.48 kg/(m^2) as calculated from the following:   Height as of this encounter: 5\' 3"  (1.6 m).   Weight as of this encounter: 78.019 kg (172 lb). Advance diet Up with therapy Discharge home with home health  DVT Prophylaxis - Lovenox, Foot Pumps and TED hose Weight-Bearing as tolerated to right leg  J. Cameron Proud, PA-C [facility] 04/18/2015, 9:09 AM

## 2015-04-18 NOTE — Care Management Note (Signed)
Case Management Note  Patient Details  Name: Gianne Shugars MRN: [id number] Date of Birth: 1951/07/22  Subjective/Objective:    Discussed discharge home health providers with Ms Pelle who chose [facility] "because I have used them in the past." A referral for PT was faxed and called to Sherian Rein at [facility]. Ms Siglin reports that she has a rolling walker and a bedside commode at home. Does not want a shower chair "because I never get into the shower."                Action/Plan:   Expected Discharge Date:  04/20/15               Expected Discharge Plan:     In-House Referral:     Discharge planning Services  CM Consult  Post Acute Care Choice:  Home Health Choice offered to:  Patient  DME Arranged:    DME Agency:     HH Arranged:  PT HH Agency:  [place]  Status of Service:  In process, will continue to follow  Medicare Important Message Given:  Yes-second notification given Date Medicare IM Given:    Medicare IM give by:    Date Additional Medicare IM Given:    Additional Medicare Important Message give by:     If discussed at [place] of Stay Meetings, dates discussed:    Additional Comments:  Cheryll Keisler A, RN 04/18/2015, 1:19 PM

## 2015-04-21 NOTE — Discharge Summary (Signed)
Diane Shea, is a 64 y.o. female  DOB Jul 10, 1951  MRN [id number].  Admission date:  04/15/2015  Admitting Physician  Vaughan Basta, MD  Discharge Date:  04/18/2015   Primary MD  Kirk Ruths., MD  Recommendations for primary care physician for things to follow:   Follow up with Dr. Marry Guan in 1 week.   Admission Diagnosis  Pain [R52] Preoperative clearance [Z01.818] Hip fracture, right, closed, initial encounter [S72.001A] Opacity of lung on imaging study [R91.8]   Discharge Diagnosis  Pain [R52] Preoperative clearance [Z01.818] Hip fracture, right, closed, initial encounter [S72.001A] Opacity of lung on imaging study [R91.8]   Principal Problem:   Hip fracture, right Active Problems:   Hip fracture      Past Medical History  Diagnosis Date  . Cancer 2003    Breast  . Graves disease   . Migraines   . Hypertension 2006  . Encounter for screening colonoscopy 04/15/2013    Past Surgical History  Procedure Laterality Date  . 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 Breast lumpectomy Left 2003  . Breast biopsy Left 2003    sentinel node  . Breast surgery Left 2003    mammosite placement  . Colonoscopy  2004  . Abdominal hysterectomy  2004  . Foot surgery    . Replacement total knee Left 2013  . Hip pinning,cannulated Right 04/16/2015    Procedure: CANNULATED HIP PINNING;  Surgeon: Dereck Leep, MD;  Location: [facility] ORS;  Service: Orthopedics;  Laterality: Right;       History of present illness and  Hospital Course:     Kindly see H&P for history of present illness and admission details, please review complete Labs, Consult reports and Test reports for all details in brief  HPI  from the history and physical done on the day of admission  64 year old female patient with  hypertension, Graves' disease had a fall at home and suffered a right hip fracture. Admitted for the subcapital fracture of the right hip.  Hospital Course  #1 right hip fracture: Admitted to medical service, seen by Dr. Marry Guan, had the percutaneous pinning of her right femoral neck fracture. Patient tolerated the procedure well.atient participated in physical therapy and the physical therapy recommended home health physical therapy. Discharge home with home health. And advised her to continue Lovenox. And she will follow up with Dr. Marry Guan. She will continue Lovenox for 14 days. 2. Hypertension controlled #3 Graves' disease Migraines    Discharge Condition: stable   Follow UP  Follow-up Information    Follow up with WOLFE,JON R., PA In 10 days.   Specialty:  Physician Assistant   Why:  For staple removal, For wound re-check   Contact information:   [place] [address] [phone]       Follow up with Kirk Ruths., MD In 1 week.   Specialty:  Internal Medicine   Contact information:   [place] [address] [phone]         Discharge Instructions  and  Discharge Medications    Discharge Instructions    Home Health    Complete by:  As directed   To provide the following care/treatments:  PT            Medication List    TAKE these medications        ALPHA LIPOIC ACID PO  Take 1 capsule by mouth at bedtime.  calcium carbonate 600 MG Tabs tablet  Commonly known as:  OS-CAL  Take 600 mg by mouth 2 (two) times daily.     cholecalciferol 1000 UNITS tablet  Commonly known as:  VITAMIN D  Take 1,000 Units by mouth daily.     enoxaparin 40 MG/0.4ML injection  Commonly known as:  LOVENOX  Inject 0.4 mLs (40 mg total) into the skin daily.     gabapentin 300 MG capsule  Commonly known as:  NEURONTIN  Take 600 mg by mouth at bedtime.     levothyroxine 137 MCG tablet  Commonly known as:  SYNTHROID,  LEVOTHROID  Take 1 tablet by mouth daily.     multivitamin with minerals tablet  Take 1 tablet by mouth daily.     oxyCODONE 5 MG immediate release tablet  Commonly known as:  Oxy IR/ROXICODONE  Take 1-2 tablets (5-10 mg total) by mouth every 4 (four) hours as needed for breakthrough pain ((for MODERATE breakthrough pain)).     polyethylene glycol powder powder  Commonly known as:  GLYCOLAX/MIRALAX  255 grams one bottle for colonoscopy prep     propranolol ER 60 MG 24 hr capsule  Commonly known as:  INDERAL LA  Take 1 capsule by mouth at bedtime.     SUMAtriptan 100 MG tablet  Commonly known as:  IMITREX  Take 1 tablet by mouth every 2 (two) hours as needed for migraine.          Diet and Activity recommendation: See Discharge Instructions above   Consults obtained -ortho   Major procedures and Radiology Reports - PLEASE review detailed and final reports for all details, in brief -      Dg Chest 2 View  04/16/2015   CLINICAL DATA:  Pre operative respiratory exam. Hip fracture. Abnormal portable radiograph.  EXAM: CHEST  2 VIEW  COMPARISON:  04/15/2015  FINDINGS: The heart size and pulmonary vascularity are normal. The lungs are clear. Benign calcification in the left breast. No osseous abnormality. The density seen at the right apex on the prior study was a confluence of osseous structures.  IMPRESSION: No active cardiopulmonary disease.   Electronically Signed   By: Lorriane Shire M.D.   On: 04/16/2015 07:32   Dg Chest Port 1 View  04/15/2015   CLINICAL DATA:  Fall 3 weeks ago.  Hip fracture.  Preoperative.  EXAM: PORTABLE CHEST - 1 VIEW  COMPARISON:  Report of a a 07/30/2002 chest radiograph.  FINDINGS: Apical lordotic positioning. Midline trachea. Normal heart size. Mildly tortuous descending thoracic aorta. No pleural effusion or pneumothorax. Surgical clips within in the left breast or left axilla. Probable scarring at the left lung base ; partial obscuration of left  hemidiaphragm laterally. Vague increased density projecting over the right apex could be artifactual.  IMPRESSION: No convincing evidence of acute cardiopulmonary disease.  Vague increased density projecting over the right apex, possibly artifactual. Consider further evaluation with PA and lateral radiographs, which can be performed non emergently.   Electronically Signed   By: Abigail Miyamoto M.D.   On: 04/15/2015 15:42   Dg Hip Operative Unilat With Pelvis Right  04/16/2015   CLINICAL DATA:  ORIF of a right subcapital femoral neck fracture.  EXAM: OPERATIVE RIGHT HIP (WITH PELVIS IF PERFORMED) 2 VIEWS  TECHNIQUE: Fluoroscopic spot image(s) were submitted for interpretation post-operatively.  COMPARISON:  04/15/2015  FINDINGS: Images show the insertion of 3 screws across the subcapital femoral neck fracture reducing the fracture fragments into near anatomic  alignment.  There is no new fracture or operative complication.  IMPRESSION: Well-aligned subcapital right femoral neck fracture following ORIF.   Electronically Signed   By: Lajean Manes M.D.   On: 04/16/2015 20:00   Dg Hip Unilat With Pelvis 2-3 Views Right  04/15/2015   CLINICAL DATA:  [place] 3 weeks ago.  Right hip pain.  EXAM: DG HIP (WITH OR WITHOUT PELVIS) 2-3V RIGHT  COMPARISON:  None.  FINDINGS: The pelvic bony ring is intact. There is sclerosis at the right SI joint. Probable scoliosis in the lower lumbar spine. There is a displaced fracture of the proximal right femur. This is suggestive for a subcapital fracture with some impaction. The right femoral head is located.  IMPRESSION: Subcapital right femur fracture.   Electronically Signed   By: Markus Daft M.D.   On: 04/15/2015 15:45    Micro Results     No results found for this or any previous visit (from the past 240 hour(s)).     Today   Subjective:   Diane Shea today has no headache,no chest abdominal pain,no new weakness tingling or numbness, feels much better wants to go home  today.   Objective:   Blood pressure 150/73, pulse 65, temperature 97.9 F (36.6 C), temperature source Oral, resp. rate 18, height 5\' 3"  (1.6 m), weight 78.019 kg (172 lb), SpO2 100 %.  No intake or output data in the 24 hours ending 04/21/15 1354  Exam Awake Alert, Oriented x 3, No new F.N deficits, Normal affect [place].AT,PERRAL Supple Neck,No JVD, No cervical lymphadenopathy appriciated.  Symmetrical Chest wall movement, Good air movement bilaterally, CTAB RRR,No Gallops,Rubs or new Murmurs, No Parasternal Heave +ve B.Sounds, Abd Soft, Non tender, No organomegaly appriciated, No rebound -guarding or rigidity. No Cyanosis, Clubbing or edema, No new Rash or bruise  Data Review   CBC w Diff:  Lab Results  Component Value Date   WBC 6.1 04/18/2015   WBC 4.9 09/09/2014   HGB 12.1 04/18/2015   HGB 11.6* 09/24/2014   HCT 35.3 04/18/2015   HCT 43.8 09/09/2014   PLT 211 04/18/2015   PLT 172 09/24/2014   LYMPHOPCT 29 04/15/2015   LYMPHOPCT 39.4 04/10/2012   MONOPCT 6 04/15/2015   MONOPCT 5.9 04/10/2012   EOSPCT 3 04/15/2015   EOSPCT 2.6 04/10/2012   BASOPCT 1 04/15/2015   BASOPCT 0.7 04/10/2012    CMP:  Lab Results  Component Value Date   NA 142 04/18/2015   NA 136 09/24/2014   K 3.6 04/18/2015   K 3.6 09/24/2014   CL 109 04/18/2015   CL 103 09/24/2014   CO2 27 04/18/2015   CO2 27 09/24/2014   BUN 10 04/18/2015   BUN 8 09/24/2014   CREATININE 0.49 04/18/2015   CREATININE 0.73 09/24/2014   PROT 7.1 04/10/2012   ALBUMIN 3.9 04/10/2012   BILITOT 0.8 04/10/2012   ALKPHOS 77 04/10/2012   AST 18 04/10/2012   ALT 27 04/10/2012  .   Total Time in preparing paper work, data evaluation and todays exam - 49 minutes  Diane Shea M.D on 04/18/2015 at 1:54 PM

## 2015-11-07 IMAGING — CR DG KNEE 1-2V*R*
1 series · 2 of 2 positions shown · non-contrast
Comparison: None.

CLINICAL DATA: Postoperative evaluation.

EXAM:
RIGHT KNEE - 1-2 VIEW

[Series 1: ap · 0.17mm/px · 2 of 2 slices shown]
[im 1/2]
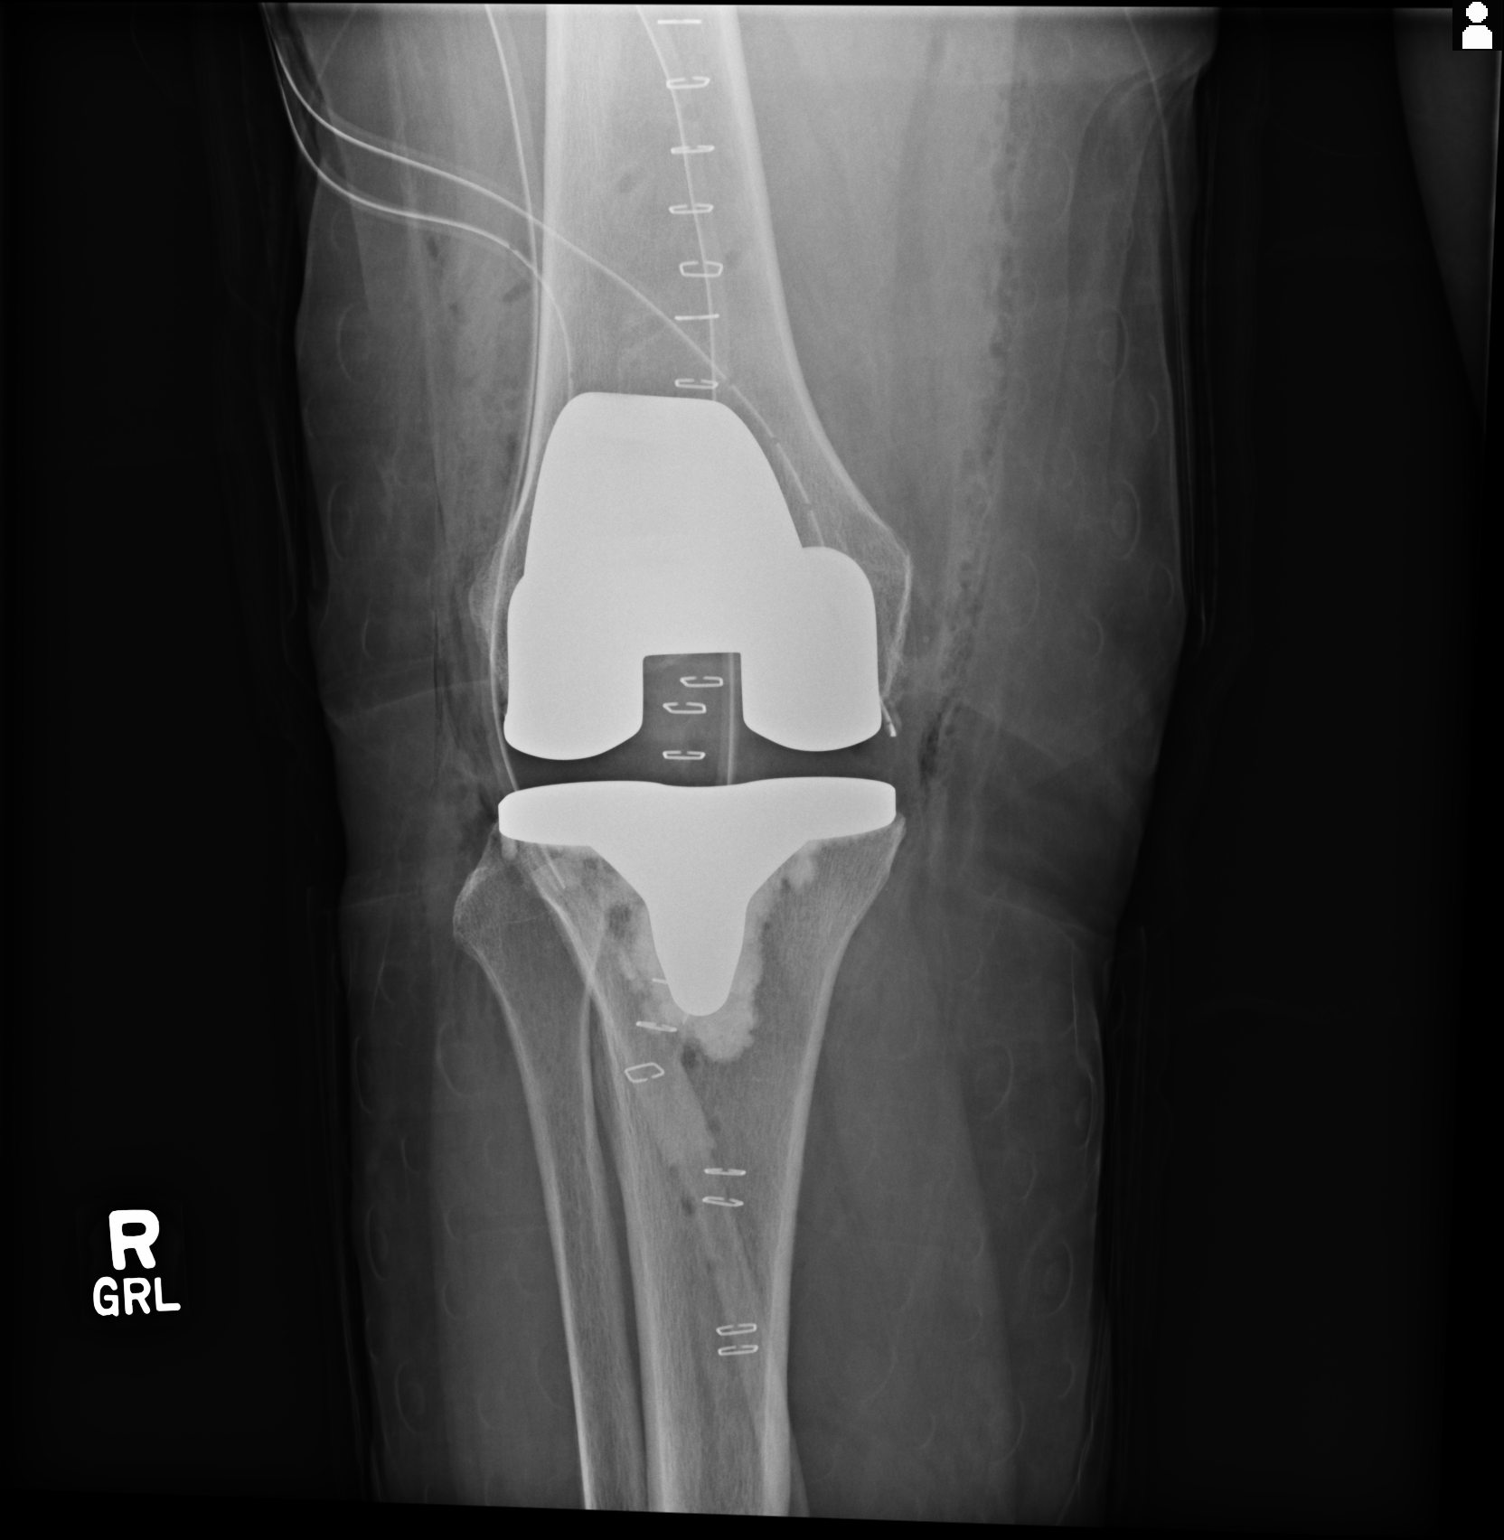
[im 2/2]
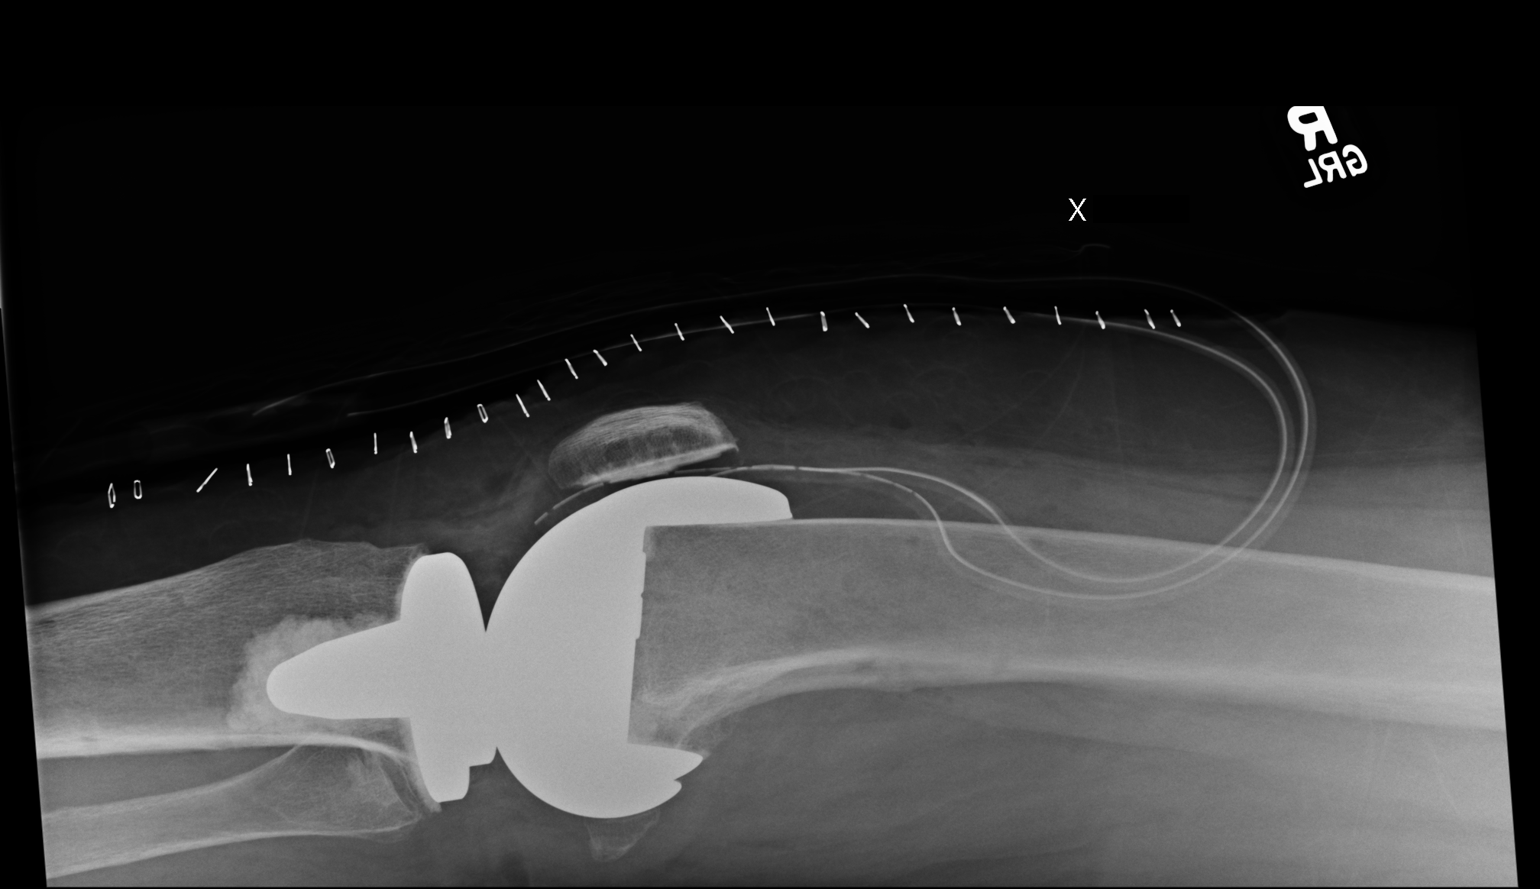

[2 of 2 positions shown; findings below may reference images not displayed]

FINDINGS: Multiple drains are demonstrated projecting within the soft tissues
about the right knee. Patient status post right knee arthroplasty.
The hardware appears well seated. No perihardware lucency. No acute
fracture. Surgical staple line overlies the right knee. Gas within
the soft tissues compatible with postoperative state.
IMPRESSION: Postoperative changes compatible with right knee arthroplasty.

## 2016-08-18 ENCOUNTER — Telehealth: Payer: Self-pay | Admitting: *Deleted

## 2016-08-18 NOTE — Telephone Encounter (Signed)
Asking to have BRCA checked She was diagnosed with Breast Cancer in 2003 and discharged form clinic in 2013, I see nothing in sunrise that she had testing done. Please contact her [phone].

## 2016-10-06 ENCOUNTER — Other Ambulatory Visit: Payer: Self-pay | Admitting: Internal Medicine

## 2016-10-06 DIAGNOSIS — Z1231 Encounter for screening mammogram for malignant neoplasm of breast: Secondary | ICD-10-CM

## 2016-10-18 ENCOUNTER — Ambulatory Visit
Admission: RE | Admit: 2016-10-18 | Discharge: 2016-10-18 | Disposition: A | Discharge status: Home / Self Care | Payer: Medicare Other | Source: Ambulatory Visit | Attending: Internal Medicine | Admitting: Internal Medicine

## 2016-10-18 DIAGNOSIS — Z1231 Encounter for screening mammogram for malignant neoplasm of breast: Secondary | ICD-10-CM | POA: Diagnosis present

## 2016-10-18 HISTORY — DX: Malignant neoplasm of unspecified site of unspecified female breast: C50.919

## 2017-11-23 ENCOUNTER — Other Ambulatory Visit: Payer: Self-pay | Admitting: Internal Medicine

## 2017-11-23 DIAGNOSIS — Z1231 Encounter for screening mammogram for malignant neoplasm of breast: Secondary | ICD-10-CM

## 2017-12-18 ENCOUNTER — Ambulatory Visit
Admission: RE | Admit: 2017-12-18 | Discharge: 2017-12-18 | Disposition: A | Discharge status: Home / Self Care | Payer: Medicare Other | Source: Ambulatory Visit | Attending: Internal Medicine | Admitting: Internal Medicine

## 2017-12-18 DIAGNOSIS — Z1231 Encounter for screening mammogram for malignant neoplasm of breast: Secondary | ICD-10-CM | POA: Insufficient documentation

## 2019-09-12 ENCOUNTER — Ambulatory Visit: Payer: Medicare PPO | Attending: Internal Medicine

## 2019-09-12 DIAGNOSIS — Z20822 Contact with and (suspected) exposure to covid-19: Secondary | ICD-10-CM | POA: Insufficient documentation

## 2019-09-14 LAB — NOVEL CORONAVIRUS, NAA: SARS-CoV-2, NAA: NOT DETECTED

## 2019-11-18 ENCOUNTER — Other Ambulatory Visit: Payer: Self-pay | Admitting: Internal Medicine

## 2019-11-18 DIAGNOSIS — Z1231 Encounter for screening mammogram for malignant neoplasm of breast: Secondary | ICD-10-CM

## 2019-11-20 ENCOUNTER — Ambulatory Visit
Admission: RE | Admit: 2019-11-20 | Discharge: 2019-11-20 | Disposition: A | Discharge status: Home / Self Care | Payer: Medicare PPO | Source: Ambulatory Visit | Attending: Internal Medicine | Admitting: Internal Medicine

## 2019-11-20 DIAGNOSIS — Z1231 Encounter for screening mammogram for malignant neoplasm of breast: Secondary | ICD-10-CM | POA: Diagnosis present

## 2019-11-20 HISTORY — DX: Personal history of irradiation: Z92.3

## 2019-12-10 ENCOUNTER — Other Ambulatory Visit: Payer: Self-pay | Admitting: Internal Medicine

## 2019-12-10 DIAGNOSIS — R1011 Right upper quadrant pain: Secondary | ICD-10-CM

## 2019-12-16 ENCOUNTER — Other Ambulatory Visit: Payer: Self-pay

## 2019-12-16 ENCOUNTER — Ambulatory Visit
Admission: RE | Admit: 2019-12-16 | Discharge: 2019-12-16 | Disposition: A | Discharge status: Home / Self Care | Payer: Medicare PPO | Source: Ambulatory Visit | Attending: Internal Medicine | Admitting: Internal Medicine

## 2019-12-16 DIAGNOSIS — R1011 Right upper quadrant pain: Secondary | ICD-10-CM

## 2021-01-01 ENCOUNTER — Other Ambulatory Visit: Payer: Self-pay | Admitting: Internal Medicine

## 2021-01-01 DIAGNOSIS — Z1231 Encounter for screening mammogram for malignant neoplasm of breast: Secondary | ICD-10-CM

## 2021-01-04 IMAGING — MG DIGITAL SCREENING BILAT W/ TOMO W/ CAD
8 series · 8 of 24 positions shown · non-contrast
Comparison: Previous exam(s).

CLINICAL DATA: Screening.

EXAM:
DIGITAL SCREENING BILATERAL MAMMOGRAM WITH TOMO AND CAD

[R CC synth-2D]
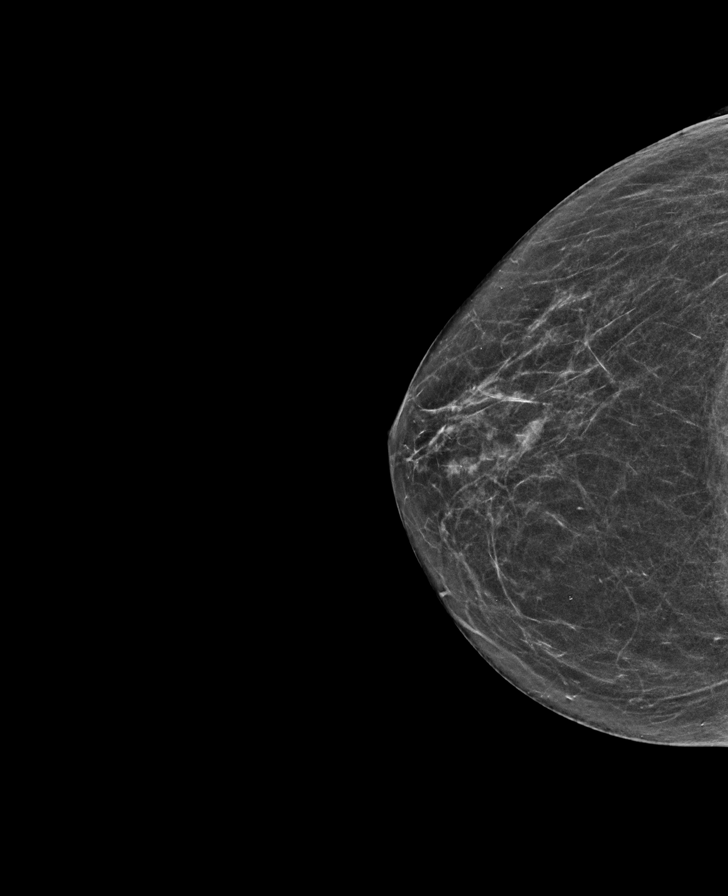

[R MLO synth-2D]
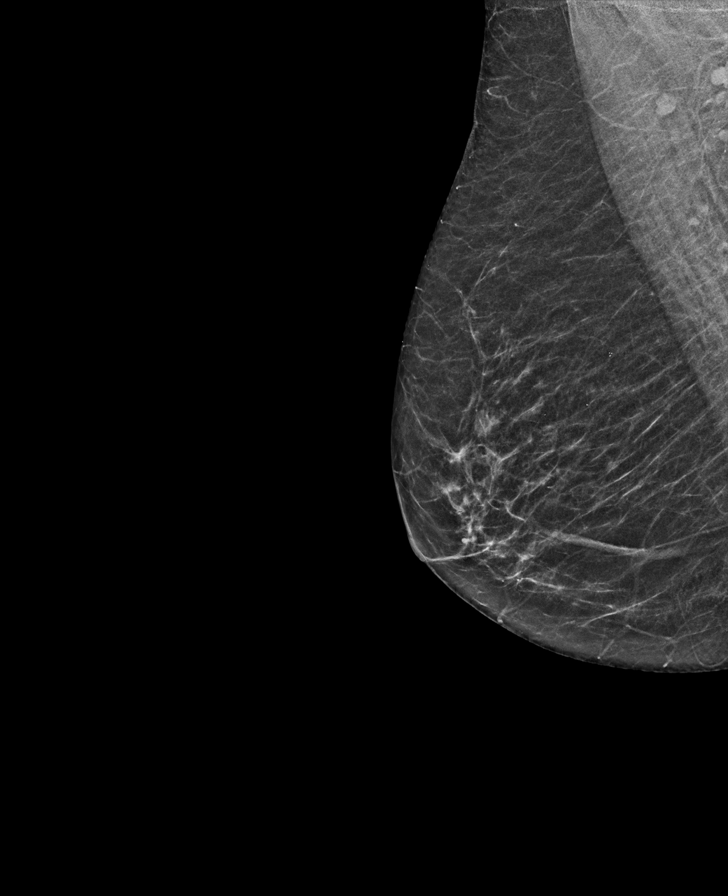

[L MLO synth-2D]
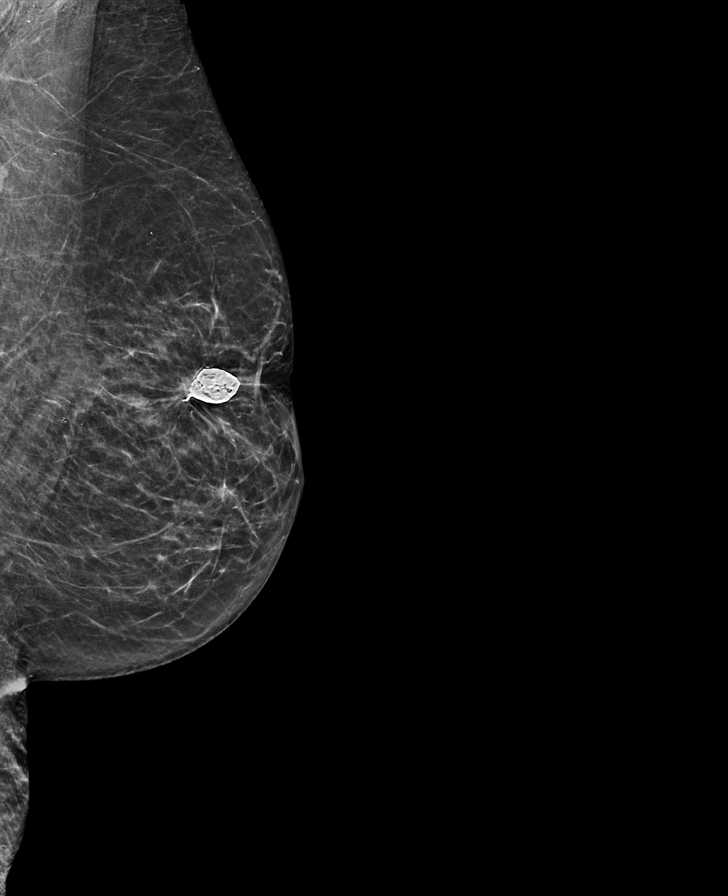

[L CC synth-2D]
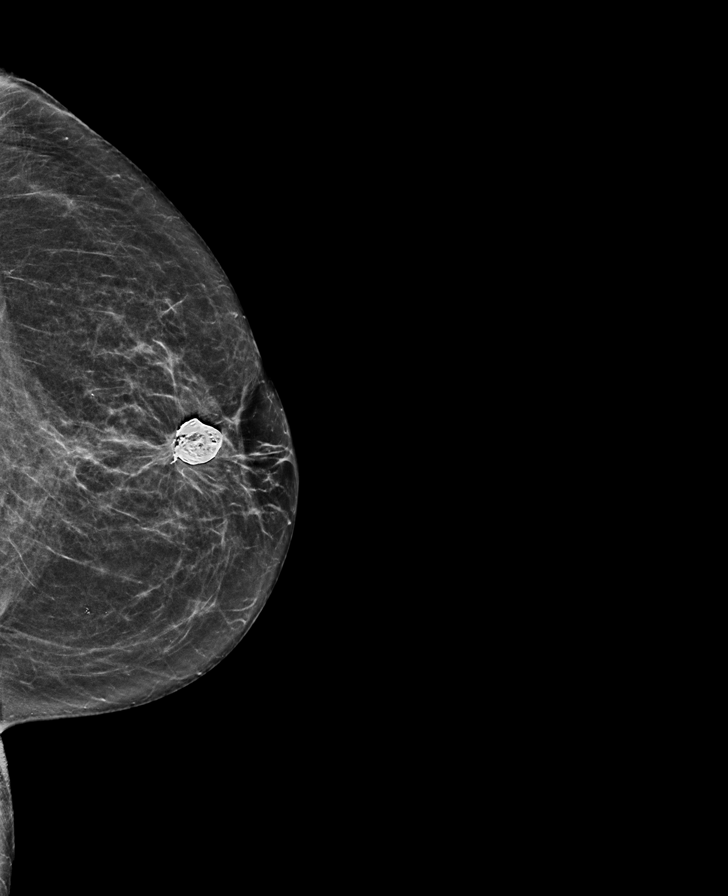

[R CC tomo · tomo slice 25/50.0]
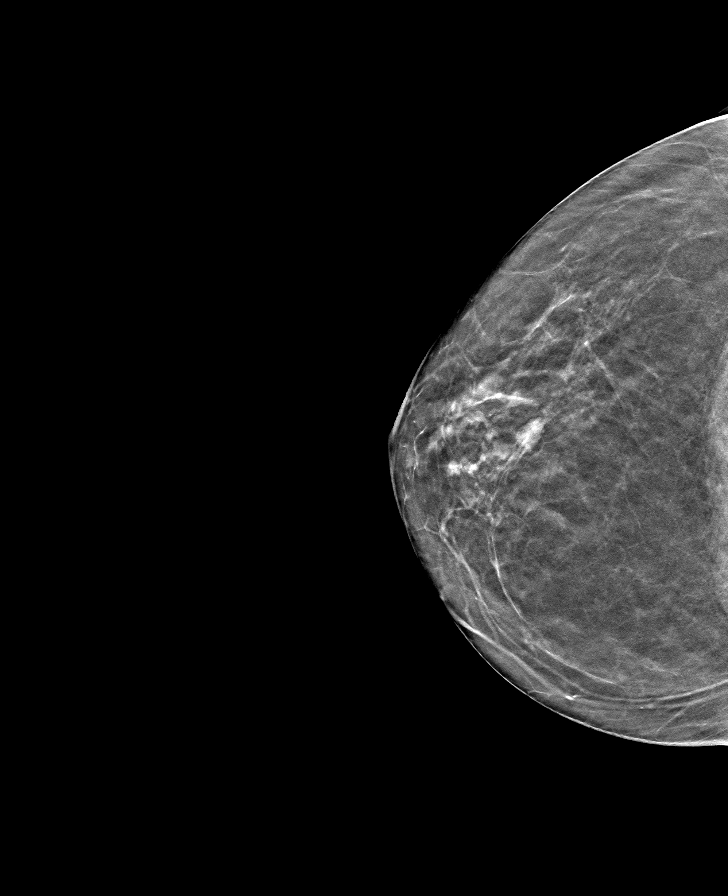

[L CC tomo · tomo slice 28/55.0]
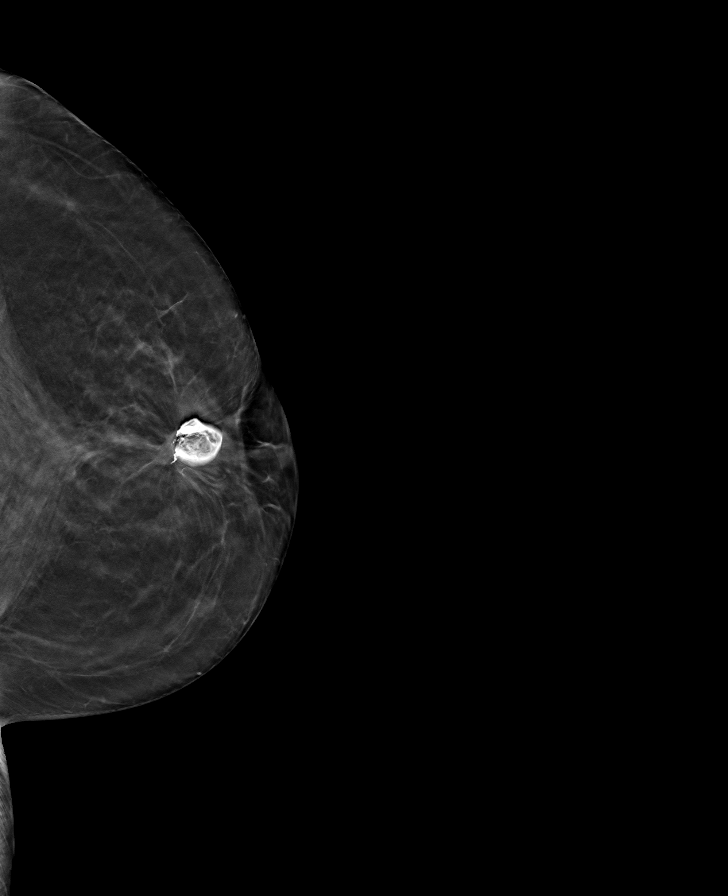

[R MLO tomo · tomo slice 25/50.0]
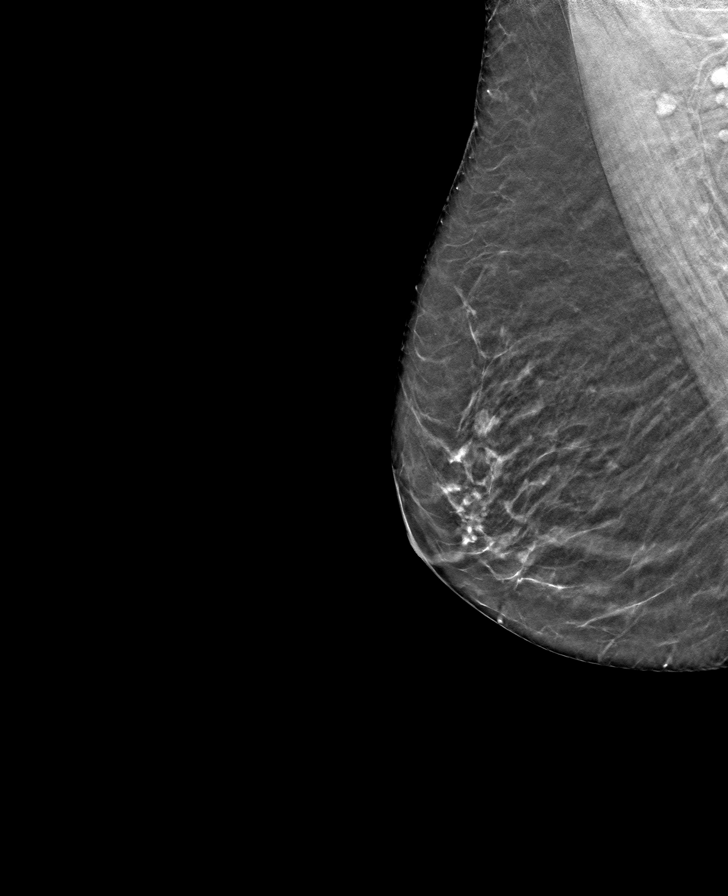

[L MLO tomo · tomo slice 27/54.0]
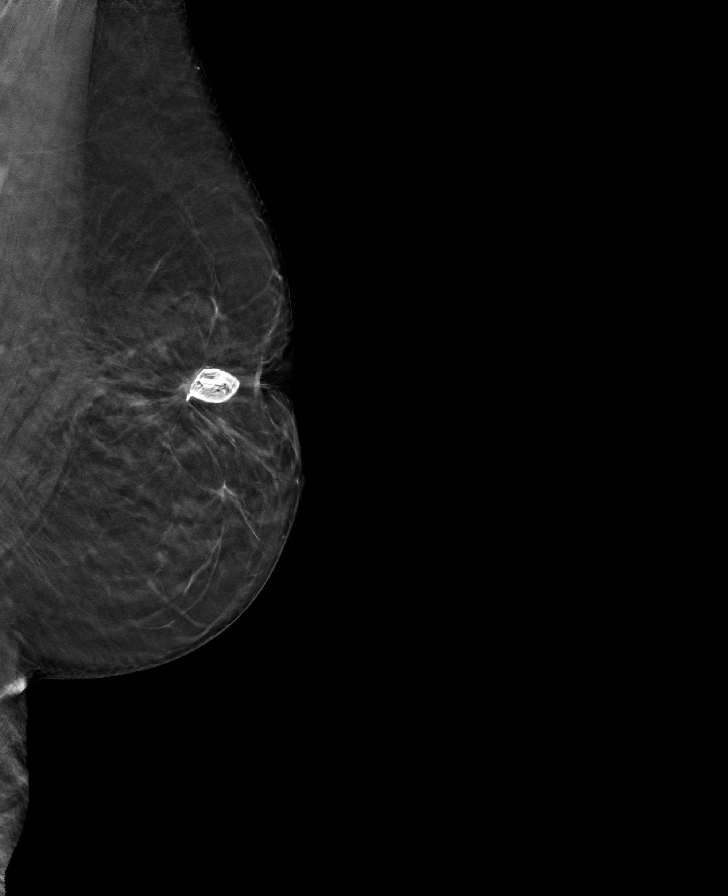

[8 of 24 positions shown; findings below may reference images not displayed]

ACR Breast Density Category b: There are scattered areas of
fibroglandular density.
FINDINGS: There are no findings suspicious for malignancy. Images were
processed with CAD.
IMPRESSION: No mammographic evidence of malignancy. A result letter of this
screening mammogram will be mailed directly to the patient.

RECOMMENDATION:
Screening mammogram in one year. (Code:[id number])

BI-RADS CATEGORY  1: Negative.

## 2021-01-08 ENCOUNTER — Ambulatory Visit
Admission: RE | Admit: 2021-01-08 | Discharge: 2021-01-08 | Disposition: A | Discharge status: Home / Self Care | Payer: Medicare PPO | Source: Ambulatory Visit | Attending: Internal Medicine | Admitting: Internal Medicine

## 2021-01-08 ENCOUNTER — Other Ambulatory Visit: Payer: Self-pay

## 2021-01-08 DIAGNOSIS — Z1231 Encounter for screening mammogram for malignant neoplasm of breast: Secondary | ICD-10-CM | POA: Insufficient documentation

## 2022-11-29 ENCOUNTER — Other Ambulatory Visit: Payer: Self-pay | Admitting: Internal Medicine

## 2022-11-29 DIAGNOSIS — Z1231 Encounter for screening mammogram for malignant neoplasm of breast: Secondary | ICD-10-CM

## 2022-12-13 ENCOUNTER — Ambulatory Visit
Admission: RE | Admit: 2022-12-13 | Discharge: 2022-12-13 | Disposition: A | Discharge status: Home / Self Care | Payer: Medicare PPO | Source: Ambulatory Visit | Attending: Internal Medicine | Admitting: Internal Medicine

## 2022-12-13 DIAGNOSIS — Z1231 Encounter for screening mammogram for malignant neoplasm of breast: Secondary | ICD-10-CM | POA: Diagnosis present

## 2023-05-24 ENCOUNTER — Other Ambulatory Visit: Payer: Self-pay | Admitting: Neurology

## 2023-05-24 DIAGNOSIS — G43101 Migraine with aura, not intractable, with status migrainosus: Secondary | ICD-10-CM

## 2023-05-26 ENCOUNTER — Ambulatory Visit
Admission: RE | Admit: 2023-05-26 | Discharge: 2023-05-26 | Disposition: A | Discharge status: Home / Self Care | Payer: Medicare PPO | Source: Ambulatory Visit | Attending: Neurology | Admitting: Neurology

## 2023-05-26 DIAGNOSIS — G43101 Migraine with aura, not intractable, with status migrainosus: Secondary | ICD-10-CM | POA: Diagnosis present

## 2023-08-17 ENCOUNTER — Emergency Department (HOSPITAL_COMMUNITY): Payer: Medicare PPO

## 2023-08-17 ENCOUNTER — Other Ambulatory Visit: Payer: Self-pay

## 2023-08-17 ENCOUNTER — Encounter (HOSPITAL_COMMUNITY): Payer: Self-pay

## 2023-08-17 ENCOUNTER — Observation Stay (HOSPITAL_COMMUNITY)
Admission: EM | Admit: 2023-08-17 | Discharge: 2023-08-18 | Disposition: A | Discharge status: Home / Self Care | Payer: Medicare PPO | Attending: Internal Medicine | Admitting: Internal Medicine

## 2023-08-17 DIAGNOSIS — W19XXXA Unspecified fall, initial encounter: Secondary | ICD-10-CM | POA: Diagnosis not present

## 2023-08-17 DIAGNOSIS — I609 Nontraumatic subarachnoid hemorrhage, unspecified: Secondary | ICD-10-CM

## 2023-08-17 DIAGNOSIS — Z23 Encounter for immunization: Secondary | ICD-10-CM | POA: Diagnosis not present

## 2023-08-17 DIAGNOSIS — Z79899 Other long term (current) drug therapy: Secondary | ICD-10-CM | POA: Diagnosis not present

## 2023-08-17 DIAGNOSIS — S066X1A Traumatic subarachnoid hemorrhage with loss of consciousness of 30 minutes or less, initial encounter: Secondary | ICD-10-CM | POA: Diagnosis not present

## 2023-08-17 DIAGNOSIS — Z853 Personal history of malignant neoplasm of breast: Secondary | ICD-10-CM | POA: Insufficient documentation

## 2023-08-17 DIAGNOSIS — E039 Hypothyroidism, unspecified: Secondary | ICD-10-CM | POA: Diagnosis present

## 2023-08-17 DIAGNOSIS — W01198A Fall on same level from slipping, tripping and stumbling with subsequent striking against other object, initial encounter: Secondary | ICD-10-CM | POA: Insufficient documentation

## 2023-08-17 DIAGNOSIS — Z96652 Presence of left artificial knee joint: Secondary | ICD-10-CM | POA: Insufficient documentation

## 2023-08-17 DIAGNOSIS — R911 Solitary pulmonary nodule: Secondary | ICD-10-CM | POA: Diagnosis not present

## 2023-08-17 DIAGNOSIS — R42 Dizziness and giddiness: Secondary | ICD-10-CM | POA: Diagnosis not present

## 2023-08-17 DIAGNOSIS — I10 Essential (primary) hypertension: Secondary | ICD-10-CM | POA: Diagnosis not present

## 2023-08-17 DIAGNOSIS — R519 Headache, unspecified: Secondary | ICD-10-CM | POA: Diagnosis present

## 2023-08-17 LAB — COMPREHENSIVE METABOLIC PANEL
ALT: 25 U/L (ref 0–44)
AST: 22 U/L (ref 15–41)
Albumin: 3.6 g/dL (ref 3.5–5.0)
Alkaline Phosphatase: 59 U/L (ref 38–126)
Anion gap: 8 (ref 5–15)
BUN: 11 mg/dL (ref 8–23)
CO2: 19 mmol/L — ABNORMAL LOW (ref 22–32)
Calcium: 9.1 mg/dL (ref 8.9–10.3)
Chloride: 108 mmol/L (ref 98–111)
Creatinine, Ser: 0.68 mg/dL (ref 0.44–1.00)
GFR, Estimated: >60 mL/min (ref >60)
Glucose, Bld: 101 mg/dL — ABNORMAL HIGH (ref 70–99)
Potassium: 3.5 mmol/L (ref 3.5–5.1)
Sodium: 135 mmol/L (ref 135–145)
Total Bilirubin: 0.9 mg/dL (ref <1.2)
Total Protein: 6.1 g/dL — ABNORMAL LOW (ref 6.5–8.1)

## 2023-08-17 LAB — CBC WITH DIFFERENTIAL/PLATELET
Abs Immature Granulocytes: 0.06 10*3/uL (ref 0.00–0.07)
Basophils Absolute: 0.1 10*3/uL (ref 0.0–0.1)
Basophils Relative: 1 %
Eosinophils Absolute: 0.1 10*3/uL (ref 0.0–0.5)
Eosinophils Relative: 2 %
HCT: 41.3 % (ref 36.0–46.0)
Hemoglobin: 14 g/dL (ref 12.0–15.0)
Immature Granulocytes: 1 %
Lymphocytes Relative: 27 %
Lymphs Abs: 2.3 10*3/uL (ref 0.7–4.0)
MCH: 31.6 pg (ref 26.0–34.0)
MCHC: 33.9 g/dL (ref 30.0–36.0)
MCV: 93.2 fL (ref 80.0–100.0)
Monocytes Absolute: 0.6 10*3/uL (ref 0.1–1.0)
Monocytes Relative: 7 %
Neutro Abs: 5.5 10*3/uL (ref 1.7–7.7)
Neutrophils Relative %: 62 %
Platelets: 268 10*3/uL (ref 150–400)
RBC: 4.43 MIL/uL (ref 3.87–5.11)
RDW: 13.7 % (ref 11.5–15.5)
WBC: 8.6 10*3/uL (ref 4.0–10.5)
nRBC: 0 % (ref 0.0–0.2)

## 2023-08-17 LAB — URINALYSIS, COMPLETE (UACMP) WITH MICROSCOPIC
Bacteria, UA: NONE SEEN
Bilirubin Urine: NEGATIVE
Glucose, UA: NEGATIVE mg/dL
Hgb urine dipstick: NEGATIVE
Ketones, ur: 15 mg/dL — AB
Leukocytes,Ua: NEGATIVE
Nitrite: NEGATIVE
Protein, ur: NEGATIVE mg/dL
RBC / HPF: NONE SEEN RBC/hpf (ref 0–5)
Specific Gravity, Urine: 1.02 (ref 1.005–1.030)
WBC, UA: NONE SEEN WBC/hpf (ref 0–5)
pH: 6 (ref 5.0–8.0)

## 2023-08-17 LAB — MAGNESIUM: Magnesium: 2.1 mg/dL (ref 1.7–2.4)

## 2023-08-17 MED ORDER — LACTATED RINGERS IV BOLUS
1000.0000 mL | Freq: Once | INTRAVENOUS | Status: AC
  Administered 2023-08-17: 1000 mL via INTRAVENOUS

## 2023-08-17 MED ORDER — DULOXETINE HCL 60 MG PO CPEP
60.0000 mg | ORAL_CAPSULE | Freq: Every day | ORAL | Status: DC
  Administered 2023-08-18: 60 mg via ORAL
  Filled 2023-08-17: qty 1

## 2023-08-17 MED ORDER — FENTANYL CITRATE PF 50 MCG/ML IJ SOSY: 50.0000 ug | PREFILLED_SYRINGE | INTRAMUSCULAR | Status: DC | PRN

## 2023-08-17 MED ORDER — ONDANSETRON HCL 4 MG/2ML IJ SOLN
4.0000 mg | Freq: Once | INTRAMUSCULAR | Status: AC
  Administered 2023-08-17: 4 mg via INTRAVENOUS
  Filled 2023-08-17: qty 2

## 2023-08-17 MED ORDER — NALOXONE HCL 0.4 MG/ML IJ SOLN: 0.4000 mg | INTRAMUSCULAR | Status: DC | PRN

## 2023-08-17 MED ORDER — MECLIZINE HCL 25 MG PO TABS
25.0000 mg | ORAL_TABLET | Freq: Once | ORAL | Status: AC
  Administered 2023-08-17: 25 mg via ORAL
  Filled 2023-08-17: qty 1

## 2023-08-17 MED ORDER — ACETAMINOPHEN 500 MG PO TABS
1000.0000 mg | ORAL_TABLET | Freq: Four times a day (QID) | ORAL | Status: DC | PRN
  Administered 2023-08-17 – 2023-08-18 (×2): 1000 mg via ORAL
  Filled 2023-08-17 (×2): qty 2

## 2023-08-17 MED ORDER — HYDRALAZINE HCL 20 MG/ML IJ SOLN: 10.0000 mg | Freq: Once | INTRAMUSCULAR | Status: DC

## 2023-08-17 MED ORDER — TOPIRAMATE 25 MG PO TABS
50.0000 mg | ORAL_TABLET | Freq: Every day | ORAL | Status: DC
Start: 2023-08-18 — End: 2023-08-18
  Administered 2023-08-18: 50 mg via ORAL
  Filled 2023-08-17: qty 2

## 2023-08-17 MED ORDER — ONDANSETRON HCL 4 MG/2ML IJ SOLN
4.0000 mg | Freq: Four times a day (QID) | INTRAMUSCULAR | Status: DC | PRN
  Filled 2023-08-17: qty 2

## 2023-08-17 MED ORDER — MELATONIN 3 MG PO TABS
3.0000 mg | ORAL_TABLET | Freq: Every evening | ORAL | Status: DC | PRN
  Administered 2023-08-17: 3 mg via ORAL
  Filled 2023-08-17: qty 1

## 2023-08-17 MED ORDER — TETANUS-DIPHTH-ACELL PERTUSSIS 5-2.5-18.5 LF-MCG/0.5 IM SUSY
0.5000 mL | PREFILLED_SYRINGE | Freq: Once | INTRAMUSCULAR | Status: AC
  Administered 2023-08-17: 0.5 mL via INTRAMUSCULAR
  Filled 2023-08-17: qty 0.5

## 2023-08-17 MED ORDER — HYDRALAZINE HCL 20 MG/ML IJ SOLN
10.0000 mg | INTRAMUSCULAR | Status: DC | PRN
  Administered 2023-08-17: 10 mg via INTRAVENOUS
  Filled 2023-08-17: qty 1

## 2023-08-17 MED ORDER — ACETAMINOPHEN 650 MG RE SUPP: 650.0000 mg | Freq: Four times a day (QID) | RECTAL | Status: DC | PRN

## 2023-08-17 MED ORDER — MORPHINE SULFATE (PF) 2 MG/ML IV SOLN
2.0000 mg | Freq: Once | INTRAVENOUS | Status: AC
  Administered 2023-08-17: 2 mg via INTRAVENOUS
  Filled 2023-08-17: qty 1

## 2023-08-17 MED ORDER — LEVOTHYROXINE SODIUM 112 MCG PO TABS
112.0000 ug | ORAL_TABLET | Freq: Every day | ORAL | Status: DC
Start: 2023-08-18 — End: 2023-08-18
  Administered 2023-08-18: 112 ug via ORAL
  Filled 2023-08-17: qty 1

## 2023-08-17 MED ORDER — ACETAMINOPHEN 325 MG PO TABS: 650.0000 mg | ORAL_TABLET | Freq: Four times a day (QID) | ORAL | Status: DC | PRN

## 2023-08-17 NOTE — ED Provider Notes (Cosign Needed Addendum)
[place] EMERGENCY DEPARTMENT AT [facility] Provider Note   CSN: [id number] Arrival date & time: 08/17/23  1930     History  Chief Complaint  Patient presents with   Diane Shea is a 72 y.o. female.  72 year old female presents here after mechanical fall.  She states she was carrying multiple things and going up some concrete steps to her home, when she lost her balance and fell backwards.  She struck the posterior portion of her head on the concrete and subsequently lost consciousness.  Patient is complaining of pain in the back of her head.  She is also endorsing dizziness that is provoked with logroll.  The history is provided by the patient.       Home Medications Prior to Admission medications   Medication Sig Start Date End Date Taking? Authorizing Provider  ALPHA LIPOIC ACID PO Take 1 capsule by mouth at bedtime.     [provider]  calcium carbonate (OS-CAL) 600 MG TABS tablet Take 600 mg by mouth 2 (two) times daily.     [provider]  cholecalciferol (VITAMIN D) 1000 UNITS tablet Take 1,000 Units by mouth daily.    [provider]  enoxaparin (LOVENOX) 40 MG/0.4ML injection Inject 0.4 mLs (40 mg total) into the skin daily. 04/18/15   Anson Oregon, PA-C  gabapentin (NEURONTIN) 300 MG capsule Take 600 mg by mouth at bedtime.    [provider]  levothyroxine (SYNTHROID, LEVOTHROID) 137 MCG tablet Take 1 tablet by mouth daily.    [provider]  Multiple Vitamins-Minerals (MULTIVITAMIN WITH MINERALS) tablet Take 1 tablet by mouth daily.    [provider]  oxyCODONE (OXY IR/ROXICODONE) 5 MG immediate release tablet Take 1-2 tablets (5-10 mg total) by mouth every 4 (four) hours as needed for breakthrough pain ((for MODERATE breakthrough pain)). 04/18/15   Anson Oregon, PA-C  polyethylene glycol powder [facility]) powder 255 grams one bottle for colonoscopy prep Patient  not taking: Reported on 04/15/2015 04/15/13   [name], MD  propranolol ER (INDERAL LA) 60 MG 24 hr capsule Take 1 capsule by mouth at bedtime.     [provider]  SUMAtriptan (IMITREX) 100 MG tablet Take 1 tablet by mouth every 2 (two) hours as needed for migraine.     [provider]      Allergies    Ibandronic acid    Review of Systems   As noted in HPI  Physical Exam Updated Vital Signs BP (!) 186/94   Pulse 72   Temp 97.9 F (36.6 C) (Oral)   Resp 18   Ht 5\' 3"  (1.6 m)   Wt 72.6 kg   SpO2 98%   BMI 28.34 kg/m  Physical Exam Vitals reviewed.  Constitutional:      General: She is not in acute distress.    Appearance: She is not ill-appearing, toxic-appearing or diaphoretic.  HENT:     Head:     Comments: Laceration to the posterior head    Mouth/Throat:     Mouth: Mucous membranes are moist.  Eyes:     Extraocular Movements: Extraocular movements intact.     Right eye: Nystagmus present.     Left eye: Nystagmus present.     Pupils: Pupils are equal, round, and reactive to light.     Comments: Vertical nystagmus noted with lateral eye movements  Cardiovascular:     Rate and Rhythm: Normal rate and regular  rhythm.     Heart sounds: Normal heart sounds. No murmur heard.    No friction rub. No gallop.  Pulmonary:     Effort: Pulmonary effort is normal. No respiratory distress.     Breath sounds: Normal breath sounds. No wheezing, rhonchi or rales.  Chest:     Comments: No crepitus or tenderness to palpation of anterior lateral chest wall Abdominal:     General: There is no distension.     Palpations: Abdomen is soft.     Tenderness: There is no abdominal tenderness. There is no guarding or rebound.  Musculoskeletal:     Right lower leg: No edema.     Left lower leg: No edema.     Comments: No tenderness to palpation of bilateral upper or lower extremities.  Skin:    General: Skin is warm and dry.  Neurological:     Mental Status:  She is alert.     Sensory: No sensory deficit.     Comments: Cranial nerves II - XII grossly intact.  Moving all 4 extremities spontaneously.     ED Results / Procedures / Treatments   Labs (all labs ordered are listed, but only abnormal results are displayed) Labs Reviewed  CBC WITH DIFFERENTIAL/PLATELET  COMPREHENSIVE METABOLIC PANEL    EKG EKG Interpretation Date/Time:  Thursday August 17 2023 19:40:37 EST Ventricular Rate:  67 PR Interval:  176 QRS Duration:  99 QT Interval:  445 QTC Calculation: 470 R Axis:   -28  Text Interpretation: Sinus rhythm Left atrial enlargement Low voltage, precordial leads Abnormal R-wave progression, early transition Left ventricular hypertrophy No significant change since last tracing Confirmed by Richardean Canal [phone]) on 08/17/2023 7:50:18 PM  Radiology No results found.  Procedures Procedures    Medications Ordered in ED Medications  lactated ringers bolus 1,000 mL (1,000 mLs Intravenous New Bag/Given 08/17/23 1943)  meclizine (ANTIVERT) tablet 25 mg (25 mg Oral Given 08/17/23 1947)  ondansetron (ZOFRAN) injection 4 mg (4 mg Intravenous Given 08/17/23 1944)  morphine (PF) 2 MG/ML injection 2 mg (2 mg Intravenous Given 08/17/23 1944)    ED Course/ Medical Decision Making/ A&P Clinical Course as of 08/18/23 0015  Thu Aug 17, 2023  2025 CT HEAD WO CONTRAST ( ) Small R SAH [JR]  2043 I spoke with neurosurgery, Dr. Conchita Paris, who does not recommend any repeat imaging or further workup of patient's traumatic SAH. [JR]    Clinical Course User Index [JR] Rolla Flatten, MD                                 Medical Decision Making Amount and/or Complexity of Data Reviewed Independent Historian: EMS Labs: ordered. Radiology: ordered and independent interpretation performed. Decision-making details documented in ED Course. ECG/medicine tests: ordered.  Risk Prescription drug management. Decision regarding  hospitalization.   72 year old female who presents here for mechanical fall.  Patient upgraded to level 2 fall.  Vitals reassuring on presentation.  GCS 15.  ABCs intact.  Logic exam notable for vertical nystagmus bilaterally with lateral eye movements.  Otherwise, no deficits on exam.  Initial diagnosis includes traumatic intracranial hemorrhage, skull fracture, acute fracture, concussion.  I independently interpreted the patient's imaging.  On my interpretation, no large volume intracranial hemorrhage noted.  However, radiology interpretation noted small area of subarachnoid hemorrhage on the right.  No additional acute injuries noted on my interpretation of imaging.  I independently interpreted the patient's  ECG, which is notable for NSR.  Normal intervals.  Left axis deviation.  No ST segment changes.  Nonischemic ECG.  I consulted neurosurgery regarding patient's traumatic subarachnoid hemorrhage.  I spoke with Dr. Conchita Paris who did not recommend repeat imaging.  No intervention or specific recommendations required from his prior perspective given the small size of patient's hemorrhage.  On reassessment, patient continues to have severe dizziness provoked with any head turning or movement.  Feel that she would benefit from hospital admission for symptom management of her likely postconcussive syndrome.  Hospitalist was consulted for admission.  I spoke with hospitalist, Dr. Arlean Hopping, who has accepted the patient for admission.  Patient's presentation is most consistent with acute presentation with potential threat to life or bodily function.         Final Clinical Impression(s) / ED Diagnoses Final diagnoses:  Traumatic subarachnoid hemorrhage with loss of consciousness of 30 minutes or less, initial encounter [facility])    Rx / DC Orders ED Discharge Orders     None         Rolla Flatten, MD 08/18/23 Garlan Fillers, MD 08/18/23 Leanord Hawking    Charlynne Pander,  MD 08/22/23 1447

## 2023-08-17 NOTE — ED Triage Notes (Addendum)
Arrives [facility] from home after walking up the stairs outside (4 of them) to the house and fell down with (+) LOC.   Family at home report she was unconscious for ~2 minutes and had no memory of event. Denies anticoagulants.   Approximate 4cm laceration on back of head.    Main complaint of dizziness upon arrival. GCS - 15

## 2023-08-17 NOTE — Plan of Care (Signed)
  Problem: Nutrition: Goal: Adequate nutrition will be maintained Outcome: Not Progressing   Problem: Coping: Goal: Level of anxiety will decrease Outcome: Not Progressing   Problem: Activity: Goal: Risk for activity intolerance will decrease Outcome: Not Progressing   Problem: Safety: Goal: Ability to remain free from injury will improve Outcome: Not Progressing

## 2023-08-17 NOTE — Progress Notes (Signed)
2301, admitted to room, patient is alert and oriented with laceration at the posterior head, she is complaining of severe dizziness upon turning hence placed on purewick until more stable. Family at bedside. Call bell on hand.

## 2023-08-17 NOTE — H&P (Signed)
History and Physical      Diane Shea ZOX:[id number] DOB: September 01, 1951 DOA: 08/17/2023; DOS: 08/17/2023  PCP: Lauro Regulus, MD  Patient coming from: home   I have personally briefly reviewed patient's old medical records in [facility]  Chief Complaint: Fall  HPI: Diane Shea is a 72 y.o. female with medical history significant for migraines, acquired hypothyroidism, who is admitted to [facility] on 08/17/2023 with residual dizziness after mechanical fall after presenting from home to [facility] ED complaining of fall.   The patient reports that she was ambulating up a total of 4 stairs at home earlier today, when she tripped, causing her to fall backwards, at which time she struck the back of her head either on the first chair or on the ground, at which time she lost consciousness.  She conveys that she did not lose consciousness before striking the back of her head, and while she is unsure as to the specific duration of her unconsciousness after striking her head, as this was an unwitnessed event, she feels that the duration of unconsciousness was less than 1 to 2 minutes.  Denies any associated tongue biting, loss of bowel/bladder function.  Denies any acute focal weakness nor any acute focal numbness or paresthesias.  Not associate any acute condition vision, facial droop, dysarthria, dysphagia.  She denies any associated, immediately preceding, or ensuing development of chest pain, shortness of breath, palpitations, diaphoresis, or presyncope.  After regaining consciousness following the aforementioned fall, she reports that she has had persistent dizziness, in the absence of vertigo.  She denies any additional falls aside from the previously described single episode at home earlier today.    She also notes a posterior headache in the absence of any visual or olfactory aura.  Not associate with any scintillating scotoma.  She conveys a history of migraines, but  reports that the headache that she is experiencing at this time is different than her migrainous headaches.  She confirms that she is on no blood thinners, including no aspirin.  Aside from the headache, she denies any resultant acute arthralgias or myalgias as a consequence of the above fall.  No recent subjective fevers or chills or rigors, or generalized myalgias.  No acute neck pain.     ED Course:  Vital signs in the ED were notable for the following: Afebrile; heart rates in the 60s to 70s; systolic blood pressures initially in the 180s, subsequent decreasing in the 150s following improvement in pain control with dose of IV morphine, as further quantified below; respiratory rate 17-21, oxygen saturation 97 to 99% on room air.  Labs were notable for the following: CMP notable for the following: Sodium 135, creatinine 0.68, glucose 101.  CBC notable for white cell count 8600, hemoglobin 14 compared to most recent prior value of 2.1 in August 2016, platelet count 268.  Per my interpretation, EKG in ED demonstrated the following: Sinus rhythm with heart rate 67, normal intervals, nonspecific T wave inversion in lead III, no evidence of ST changes, including no evidence of ST elevation.  Imaging in the ED, per corresponding formal radiology read, was notable for the following: 1 view chest x-ray showed no evidence of acute cardiopulmonary process, including no evidence of infiltrate, edema, effusion, or pneumothorax.  Plain films of the pelvis showed no evidence of acute fracture or dislocation.  Noncontrast CT head showed trace right frontal subarachnoid hemorrhage, without any evidence of associated mass effect or midline shift, while showing  no evidence of additional acute intracranial process, including no evidence of acute infarct.  CT cervical spine showed no evidence of acute cervical spine fracture or subluxation injury, will also noting the presence of a 11 mm left apical subpleural nodule,  with associated recommendation from radiology for pursuit of dedicated CT chest.  EDP discussed patient's case with on-call neurosurgery, who after reviewing case as well as imaging, did not feel that any additional evaluation or management of the very small SAH was warranted at this time.  Specifically, they did not feel that a repeat CT head was indicated and that outpatient follow-up in NS was not warranted. NS available as needed if additional consultation is necessary.   While in the ED, the following were administered: Meclizine 25 mg p.o. x 1 dose, morphine 2 mg IV x 1, Zofran 4 mg IV x 1, lactated Ringer's x 1 L bolus.  Subsequently, the patient was admitted for further evaluation management of dizziness after ground-level mechanical fall, with presentation also notable for trace right frontal subarachnoid hemorrhage.    Review of Systems: As per HPI otherwise 10 point review of systems negative.   Past Medical History:  Diagnosis Date   Breast cancer (HCC) 2003   left breast cancer, radiation   Cancer (HCC) 2003   Breast   Encounter for screening colonoscopy 04/15/2013   Graves disease    Hypertension 2006   Migraines    Personal history of radiation therapy     Past Surgical History:  Procedure Laterality Date   ABDOMINAL HYSTERECTOMY  2004   BREAST EXCISIONAL BIOPSY Left 2003   sentinel node   BREAST LUMPECTOMY Left 2003   BREAST SURGERY Left 2003   mammosite placement   COLONOSCOPY  2004   CYST EXCISION  2009   vocal cord   FOOT SURGERY     HIP PINNING,CANNULATED Right 04/16/2015   Procedure: CANNULATED HIP PINNING;  Surgeon: Donato Heinz, MD;  Location: [facility] ORS;  Service: Orthopedics;  Laterality: Right;   REPLACEMENT TOTAL KNEE Left 2013    Social History:  reports that she has never smoked. She has never used smokeless tobacco. She reports that she does not drink alcohol and does not use drugs.   Allergies  Allergen Reactions   Sulfa Antibiotics     Ibandronic Acid Other (See Comments)    Arthralgia's    Family History  Problem Relation Age of Onset   CAD Father    Prostate cancer Father    Breast cancer Neg Hx     Family history reviewed and not pertinent    Prior to Admission medications   Medication Sig Start Date End Date Taking? Authorizing Provider  calcium carbonate (OS-CAL) 600 MG TABS tablet Take 600 mg by mouth 2 (two) times daily.    Yes [provider]  cholecalciferol (VITAMIN D) 1000 UNITS tablet Take 1,000 Units by mouth daily.   Yes [provider]  DULoxetine (CYMBALTA) 60 MG capsule Take 60 mg by mouth daily. 08/22/22  Yes [provider]  enoxaparin (LOVENOX) 40 MG/0.4ML injection Inject 0.4 mLs (40 mg total) into the skin daily. 04/18/15  Yes Anson Oregon, PA-C  gabapentin (NEURONTIN) 300 MG capsule Take 600 mg by mouth at bedtime.   Yes [provider]  levothyroxine (SYNTHROID, LEVOTHROID) 137 MCG tablet Take 1 tablet by mouth daily.   Yes [provider]  Multiple Vitamins-Minerals (MULTIVITAMIN WITH MINERALS) tablet Take 1 tablet by mouth daily.   Yes [provider]  oxyCODONE (OXY IR/ROXICODONE) 5 MG immediate release tablet Take 1-2 tablets (5-10 mg total) by mouth every 4 (four) hours as needed for breakthrough pain ((for MODERATE breakthrough pain)). 04/18/15  Yes Anson Oregon, PA-C  polyethylene glycol powder [facility]) powder 255 grams one bottle for colonoscopy prep 04/15/13  Yes Byrnett, Merrily Pew, MD  propranolol ER (INDERAL LA) 60 MG 24 hr capsule Take 1 capsule by mouth at bedtime.    Yes [provider]  SUMAtriptan (IMITREX) 100 MG tablet Take 1 tablet by mouth every 2 (two) hours as needed for migraine.    Yes [provider]  topiramate (TOPAMAX) 25 MG tablet Take 25 mg by mouth daily. 05/04/23  Yes [provider]  ALPHA LIPOIC ACID PO Take 1 capsule by mouth at bedtime.     [provider]  topiramate (TOPAMAX) 50 MG tablet Take 50 mg by mouth daily.    [provider]     Objective    Physical Exam: Vitals:   08/17/23 1937 08/17/23 1945 08/17/23 2055 08/17/23 2057  BP:  (!) 186/94 (!) 186/94 (!) 155/71  Pulse: 71 72 72 68  Resp: (!) 21 18 18 17   Temp: 97.9 F (36.6 C)  97.9 F (36.6 C)   TempSrc: Oral  Oral   SpO2: 99% 98% 98% 99%  Weight:      Height:        General: appears to be stated age; alert, oriented Skin: warm, dry, no rash Head: Small laceration with associated hemostasis noted on the posterior aspect of the head; otherwise, AT/[place] Mouth:  Oral mucosa membranes appear moist, normal dentition Neck: supple; trachea midline Heart:  RRR; did not appreciate any M/R/G Lungs: CTAB, did not appreciate any wheezes, rales, or rhonchi Abdomen: + BS; soft, ND, NT Vascular: 2+ pedal pulses b/l; 2+ radial pulses b/l Extremities: no peripheral edema, no muscle wasting Neuro: strength and sensation intact in upper and lower extremities b/l    Labs on Admission: I have personally reviewed following labs and imaging studies  CBC: Recent Labs  Lab 08/17/23 1948  WBC 8.6  NEUTROABS 5.5  HGB 14.0  HCT 41.3  MCV 93.2  PLT 268   Basic Metabolic Panel: Recent Labs  Lab 08/17/23 1948  NA 135  K 3.5  CL 108  CO2 19*  GLUCOSE 101*  BUN 11  CREATININE 0.68  CALCIUM 9.1   GFR: Estimated Creatinine Clearance: 59.8 mL/min (by C-G formula based on SCr of 0.68 mg/dL). Liver Function Tests: Recent Labs  Lab 08/17/23 1948  AST 22  ALT 25  ALKPHOS 59  BILITOT 0.9  PROT 6.1*  ALBUMIN 3.6   No results for input(s): "LIPASE", "AMYLASE" in the last 168 hours. No results for input(s): "AMMONIA" in the last 168 hours. Coagulation Profile: No results for input(s): "INR", "PROTIME" in the last 168 hours. Cardiac Enzymes: No results for input(s): "CKTOTAL", "CKMB", "CKMBINDEX", "TROPONINI" in the last 168 hours. BNP (last 3 results) No  results for input(s): "PROBNP" in the last 8760 hours. HbA1C: No results for input(s): "HGBA1C" in the last 72 hours. CBG: No results for input(s): "GLUCAP" in the last 168 hours. Lipid Profile: No results for input(s): "CHOL", "HDL", "LDLCALC", "TRIG", "CHOLHDL", "LDLDIRECT" in the last 72 hours. Thyroid Function Tests: No results for input(s): "TSH", "T4TOTAL", "FREET4", "T3FREE", "THYROIDAB" in the last 72 hours. Anemia Panel: No results for input(s): "VITAMINB12", "FOLATE", "FERRITIN", "TIBC", "IRON", "RETICCTPCT" in the last 72 hours. Urine  analysis:    Component Value Date/Time   COLORURINE Yellow 09/09/2014 1114   APPEARANCEUR Clear 09/09/2014 1114   LABSPEC 1.014 09/09/2014 1114   PHURINE 6.0 09/09/2014 1114   GLUCOSEU Negative 09/09/2014 1114   HGBUR Negative 09/09/2014 1114   BILIRUBINUR Negative 09/09/2014 1114   KETONESUR Negative 09/09/2014 1114   PROTEINUR Negative 09/09/2014 1114   NITRITE Negative 09/09/2014 1114   LEUKOCYTESUR Negative 09/09/2014 1114    Radiological Exams on Admission: CT HEAD WO CONTRAST ( ) Result Date: 08/17/2023 CLINICAL DATA:  Trauma.  Fall. EXAM: CT HEAD WITHOUT CONTRAST CT CERVICAL SPINE WITHOUT CONTRAST TECHNIQUE: Multidetector CT imaging of the head and cervical spine was performed following the standard protocol without intravenous contrast. Multiplanar CT image reconstructions of the cervical spine were also generated. RADIATION DOSE REDUCTION: This exam was performed according to the departmental dose-optimization program which includes automated exposure control, adjustment of the mA and/or kV according to patient size and/or use of iterative reconstruction technique. COMPARISON:  None Available. FINDINGS: CT HEAD FINDINGS Brain: Mild age-related atrophy. The gray-white matter discrimination is preserved. Faint minimal high attenuating intra sulcal density in the right frontal lobe (25-26/3) most consistent with trace subarachnoid  hemorrhage. No mass effect or midline shift. Vascular: No hyperdense vessel or unexpected calcification. Skull: Normal. Negative for fracture or focal lesion. Sinuses/Orbits: No acute finding. Other: Mild left posterior parietal scalp contusion. CT CERVICAL SPINE FINDINGS Alignment: No acute subluxation. Skull base and vertebrae: No acute fracture.  Osteopenia. Soft tissues and spinal canal: No prevertebral fluid or swelling. No visible canal hematoma. Disc levels:  No acute findings.  Degenerative changes. Upper chest: An 11 mm left apical subpleural nodule. Dedicated chest CT is recommended for further evaluation. Other: Mild bilateral carotid bulb calcified plaques. IMPRESSION: 1. Trace right frontal subarachnoid hemorrhage. No mass effect or midline shift. 2. No acute/traumatic cervical spine pathology. 3. An 11 mm left apical subpleural nodule. Dedicated chest CT is recommended for further evaluation. These results were called by telephone at the time of interpretation on 08/17/2023 at 8:25 pm to DR [facility], who verbally acknowledged these results. Electronically Signed   By: Elgie Collard M.D.   On: 08/17/2023 20:27   CT Cervical Spine Wo Contrast Result Date: 08/17/2023 CLINICAL DATA:  Trauma.  Fall. EXAM: CT HEAD WITHOUT CONTRAST CT CERVICAL SPINE WITHOUT CONTRAST TECHNIQUE: Multidetector CT imaging of the head and cervical spine was performed following the standard protocol without intravenous contrast. Multiplanar CT image reconstructions of the cervical spine were also generated. RADIATION DOSE REDUCTION: This exam was performed according to the departmental dose-optimization program which includes automated exposure control, adjustment of the mA and/or kV according to patient size and/or use of iterative reconstruction technique. COMPARISON:  None Available. FINDINGS: CT HEAD FINDINGS Brain: Mild age-related atrophy. The gray-white matter discrimination is preserved. Faint minimal high attenuating  intra sulcal density in the right frontal lobe (25-26/3) most consistent with trace subarachnoid hemorrhage. No mass effect or midline shift. Vascular: No hyperdense vessel or unexpected calcification. Skull: Normal. Negative for fracture or focal lesion. Sinuses/Orbits: No acute finding. Other: Mild left posterior parietal scalp contusion. CT CERVICAL SPINE FINDINGS Alignment: No acute subluxation. Skull base and vertebrae: No acute fracture.  Osteopenia. Soft tissues and spinal canal: No prevertebral fluid or swelling. No visible canal hematoma. Disc levels:  No acute findings.  Degenerative changes. Upper chest: An 11 mm left apical subpleural nodule. Dedicated chest CT is recommended for further evaluation. Other: Mild bilateral carotid bulb calcified plaques. IMPRESSION:  1. Trace right frontal subarachnoid hemorrhage. No mass effect or midline shift. 2. No acute/traumatic cervical spine pathology. 3. An 11 mm left apical subpleural nodule. Dedicated chest CT is recommended for further evaluation. These results were called by telephone at the time of interpretation on 08/17/2023 at 8:25 pm to DR [facility], who verbally acknowledged these results. Electronically Signed   By: Elgie Collard M.D.   On: 08/17/2023 20:27   DG Pelvis Portable Result Date: 08/17/2023 CLINICAL DATA:  Larey Seat EXAM: PORTABLE PELVIS 1-2 VIEWS COMPARISON:  04/16/2015 FINDINGS: Supine frontal view of the pelvis excludes portions of the proximal right femur due to collimation. Prior ORIF of the right femoral neck. No evidence of acute fracture, subluxation, or dislocation. Mild symmetrical bilateral hip osteoarthritis. IMPRESSION: 1. No acute displaced fracture. Electronically Signed   By: Sharlet Salina M.D.   On: 08/17/2023 20:00   DG Chest Port 1 View Result Date: 08/17/2023 CLINICAL DATA:  Larey Seat EXAM: PORTABLE CHEST 1 VIEW COMPARISON:  04/16/2015 FINDINGS: Single frontal view of the chest demonstrates a stable cardiac silhouette, with  continued ectasia of the thoracic aorta. No acute airspace disease, effusion, or pneumothorax. There are no acute displaced fractures. IMPRESSION: 1. Stable chest, no acute process. Electronically Signed   By: Sharlet Salina M.D.   On: 08/17/2023 19:59      Assessment/Plan   Principal Problem:   Dizziness Active Problems:   [facility] (subarachnoid hemorrhage) (HCC)   Pulmonary nodule   Acquired hypothyroidism   Headache     #) Dizziness: Residual dizziness after striking the back of her head as a component of mechanical fall down 3-4 stairs earlier today, with associated loss of consciousness that occurred at the time that her head struck the first stair/ground.  Appears less consistent with vertigo.  Rather, given the fall down multiple stairs, with resultant loss of consciousness, differential appears to favor sequela I of postconcussive syndrome.  Her CT head does show a trace subarachnoid hemorrhage, although this appears to be in the right frontal aspect, as opposed to the posterior aspect, without any associated/contributory midline shift or mass effect.  As patient lives at home by herself, she is at increased risk for additional falls.  Will pursue additional supportive care as outlined below.  No evidence of acute focal neurologic deficits.  Plan: Fall precautions ordered.  PT consult ordered.  Vestibular physical therapy consult also placed.  Every 4 hours neurochecks ordered.                  # (Mechanical fall: Patient face mechanical fall that occurred earlier today when she tripped down 3-4 stairs, strengthening the posterior aspect of her head on the first-year versus the floor below, with resultant loss of consciousness.  No evidence of acute fracture or dislocation.  However, CT head shows trace right frontal subarachnoid hemorrhage, in the absence of any use of outpatient blood thinners, as further detailed above.  While this appears purely mechanical in  nature, will also check urinalysis to evaluate for any underlying/predisposing infectious contributory factor.  Plan: Check urinalysis.  Fall precautions ordered.  PT consult placed in the morning, as above.                 #) Trace right frontal subarachnoid hemorrhage: In the setting of mechanical fall down 3-4 stairs, today's CT head shows evidence of a trace right frontal subarachnoid hemorrhage, in the absence of any associated mass effect or midline shift.  The patient  is not on any blood thinners, and presenting hemoglobin appears stable relative to baseline, as quantified above.  Not associate with any acute focal neurologic deficits.  No clinical evidence to suggest increased intracranial pressure, including no evidence of bradycardia or irregular respirations.,  Her blood pressure was initially mildly elevated, although this is improved following improvement in pain control relating to her headache, as further quantified above.   EDP discussed patient's case with on-call neurosurgery, who after reviewing case as well as imaging, did not feel that any additional evaluation or management of the very small SAH was warranted at this time.  Specifically, they did not feel that a repeat CT head was indicated and that outpatient follow-up in NS was not warranted. NS available as needed if additional consultation is necessary.   Plan: Precautions ordered.  Acute 4-hour neurochecks ordered overnight.  Monitor on telemetry.  As needed IV hydralazine for systolic blood pressure greater than 160 or diastolic blood pressure greater than 100.  Add on PTT, INR.  Acetaminophen 1 g p.o. every 6 hours.  As needed IV fentanyl.  Repeat CBC in the morning.                  #) Pulmonary nodule: Incidental finding identified on screening CT cervical spine, with this imaging noting a 11 mm left apical subpleural nodule, with associated radiology recommendation for a dedicated CT  chest.  Plan: Per radiology recommendations, will convey recommendation for a dedicated CT chest, as above.                    #) acquired hypothyroidism: documented h/o such, on Synthroid as outpatient.   Plan: cont home Synthroid.       DVT prophylaxis: SCD's   Code Status: Full code Family Communication: none Disposition Plan: Per Rounding Team Consults called: EDP d/w on-call neurosurgery, as further detailed above;  Admission status: observation     I SPENT GREATER THAN 75  MINUTES IN CLINICAL CARE TIME/MEDICAL DECISION-MAKING IN COMPLETING THIS ADMISSION.      Chaney Born Shanessa Hodak DO [facility]  From 7PM - 7AM   08/17/2023, 9:18 PM

## 2023-08-17 NOTE — ED Notes (Signed)
ED TO INPATIENT HANDOFF REPORT  ED Nurse Name and Phone #: Manus Gunning, RN   S Name/Age/Gender Laverle Patter 72 y.o. female Room/Bed: TRABC/TRABC  Code Status   Code Status: Full Code  Home/SNF/Other Home Patient oriented to: self, place, time, and situation Is this baseline? Yes   Triage Complete: Triage complete  Chief Complaint Dizziness [R42]  Triage Note Arrives [facility] from home after walking up the stairs outside (4 of them) to the house and fell down with (+) LOC.   Family at home report she was unconscious for ~2 minutes and had no memory of event. Denies anticoagulants.   Approximate 4cm laceration on back of head.    Main complaint of dizziness upon arrival. GCS - 15   Allergies Allergies  Allergen Reactions   Sulfa Antibiotics    Ibandronic Acid Other (See Comments)    Arthralgia's    Level of Care/Admitting Diagnosis ED Disposition     ED Disposition  Admit   Condition  --   Comment  Hospital Area: [facility] [[id number]]  Level of Care: Progressive [102]  Admit to Progressive based on following criteria: MULTISYSTEM THREATS such as stable sepsis, metabolic/electrolyte imbalance with or without encephalopathy that is responding to early treatment.  May place patient in observation at [facility] or [facility] if equivalent level of care is available:: No  Covid Evaluation: Asymptomatic - no recent exposure (last 10 days) testing not required  Diagnosis: Dizziness [010272]  Admitting Physician: Angie Fava [[id number]]  Attending Physician: Angie Fava [[id number]]          B Medical/Surgery History Past Medical History:  Diagnosis Date   Breast cancer (HCC) 2003   left breast cancer, radiation   Cancer (HCC) 2003   Breast   Encounter for screening colonoscopy 04/15/2013   Graves disease    Hypertension 2006   Migraines    Personal history of radiation therapy    Past Surgical History:  Procedure  Laterality Date   ABDOMINAL HYSTERECTOMY  2004   BREAST EXCISIONAL BIOPSY Left 2003   sentinel node   BREAST LUMPECTOMY Left 2003   BREAST SURGERY Left 2003   mammosite placement   COLONOSCOPY  2004   CYST EXCISION  2009   vocal cord   FOOT SURGERY     HIP PINNING,CANNULATED Right 04/16/2015   Procedure: CANNULATED HIP PINNING;  Surgeon: Donato Heinz, MD;  Location: [facility] ORS;  Service: Orthopedics;  Laterality: Right;   REPLACEMENT TOTAL KNEE Left 2013     A IV Location/Drains/Wounds Patient Lines/Drains/Airways Status     Active Line/Drains/Airways     Name Placement date Placement time Site Days   Peripheral IV 08/17/23 20 G 1" Anterior;Left;Proximal Forearm 08/17/23  1932  Forearm  less than 1            Intake/Output Last 24 hours  Intake/Output Summary (Last 24 hours) at 08/17/2023 2122 Last data filed at 08/17/2023 2058 Gross per 24 hour  Intake --  Output 400 ml  Net -400 ml    Labs/Imaging Results for orders placed or performed during the hospital encounter of 08/17/23 (from the past 48 hours)  CBC with Differential     Status: None   Collection Time: 08/17/23  7:48 PM  Result Value Ref Range   WBC 8.6 4.0 - 10.5 K/uL   RBC 4.43 3.87 - 5.11 MIL/uL   Hemoglobin 14.0 12.0 - 15.0 g/dL   HCT 56.3 87.5 - 64.3 %  MCV 93.2 80.0 - 100.0 fL   MCH 31.6 26.0 - 34.0 pg   MCHC 33.9 30.0 - 36.0 g/dL   RDW 56.3 87.5 - 64.3 %   Platelets 268 150 - 400 K/uL   nRBC 0.0 0.0 - 0.2 %   Neutrophils Relative % 62 %   Neutro Abs 5.5 1.7 - 7.7 K/uL   Lymphocytes Relative 27 %   Lymphs Abs 2.3 0.7 - 4.0 K/uL   Monocytes Relative 7 %   Monocytes Absolute 0.6 0.1 - 1.0 K/uL   Eosinophils Relative 2 %   Eosinophils Absolute 0.1 0.0 - 0.5 K/uL   Basophils Relative 1 %   Basophils Absolute 0.1 0.0 - 0.1 K/uL   Immature Granulocytes 1 %   Abs Immature Granulocytes 0.06 0.00 - 0.07 K/uL    Comment: Performed at [facility], [address] [place], [place]  [address]  Comprehensive metabolic panel     Status: Abnormal   Collection Time: 08/17/23  7:48 PM  Result Value Ref Range   Sodium 135 135 - 145 mmol/L   Potassium 3.5 3.5 - 5.1 mmol/L   Chloride 108 98 - 111 mmol/L   CO2 19 (L) 22 - 32 mmol/L   Glucose, Bld 101 (H) 70 - 99 mg/dL    Comment: Glucose reference range applies only to samples taken after fasting for at least 8 hours.   BUN 11 8 - 23 mg/dL   Creatinine, Ser 8.84 0.44 - 1.00 mg/dL   Calcium 9.1 8.9 - 16.6 mg/dL   Total Protein 6.1 (L) 6.5 - 8.1 g/dL   Albumin 3.6 3.5 - 5.0 g/dL   AST 22 15 - 41 U/L   ALT 25 0 - 44 U/L   Alkaline Phosphatase 59 38 - 126 U/L   Total Bilirubin 0.9 <1.2 mg/dL   GFR, Estimated >06 >30 mL/min    Comment: (NOTE) Calculated using the CKD-EPI Creatinine Equation (2021)    Anion gap 8 5 - 15    Comment: Performed at [facility], [address] [place], [place] [address]   CT HEAD WO CONTRAST ( ) Result Date: 08/17/2023 CLINICAL DATA:  Trauma.  Fall. EXAM: CT HEAD WITHOUT CONTRAST CT CERVICAL SPINE WITHOUT CONTRAST TECHNIQUE: Multidetector CT imaging of the head and cervical spine was performed following the standard protocol without intravenous contrast. Multiplanar CT image reconstructions of the cervical spine were also generated. RADIATION DOSE REDUCTION: This exam was performed according to the departmental dose-optimization program which includes automated exposure control, adjustment of the mA and/or kV according to patient size and/or use of iterative reconstruction technique. COMPARISON:  None Available. FINDINGS: CT HEAD FINDINGS Brain: Mild age-related atrophy. The gray-white matter discrimination is preserved. Faint minimal high attenuating intra sulcal density in the right frontal lobe (25-26/3) most consistent with trace subarachnoid hemorrhage. No mass effect or midline shift. Vascular: No hyperdense vessel or unexpected calcification. Skull: Normal. Negative for fracture or focal  lesion. Sinuses/Orbits: No acute finding. Other: Mild left posterior parietal scalp contusion. CT CERVICAL SPINE FINDINGS Alignment: No acute subluxation. Skull base and vertebrae: No acute fracture.  Osteopenia. Soft tissues and spinal canal: No prevertebral fluid or swelling. No visible canal hematoma. Disc levels:  No acute findings.  Degenerative changes. Upper chest: An 11 mm left apical subpleural nodule. Dedicated chest CT is recommended for further evaluation. Other: Mild bilateral carotid bulb calcified plaques. IMPRESSION: 1. Trace right frontal subarachnoid hemorrhage. No mass effect or midline shift. 2. No  acute/traumatic cervical spine pathology. 3. An 11 mm left apical subpleural nodule. Dedicated chest CT is recommended for further evaluation. These results were called by telephone at the time of interpretation on 08/17/2023 at 8:25 pm to DR [facility], who verbally acknowledged these results. Electronically Signed   By: Elgie Collard M.D.   On: 08/17/2023 20:27   CT Cervical Spine Wo Contrast Result Date: 08/17/2023 CLINICAL DATA:  Trauma.  Fall. EXAM: CT HEAD WITHOUT CONTRAST CT CERVICAL SPINE WITHOUT CONTRAST TECHNIQUE: Multidetector CT imaging of the head and cervical spine was performed following the standard protocol without intravenous contrast. Multiplanar CT image reconstructions of the cervical spine were also generated. RADIATION DOSE REDUCTION: This exam was performed according to the departmental dose-optimization program which includes automated exposure control, adjustment of the mA and/or kV according to patient size and/or use of iterative reconstruction technique. COMPARISON:  None Available. FINDINGS: CT HEAD FINDINGS Brain: Mild age-related atrophy. The gray-white matter discrimination is preserved. Faint minimal high attenuating intra sulcal density in the right frontal lobe (25-26/3) most consistent with trace subarachnoid hemorrhage. No mass effect or midline shift. Vascular:  No hyperdense vessel or unexpected calcification. Skull: Normal. Negative for fracture or focal lesion. Sinuses/Orbits: No acute finding. Other: Mild left posterior parietal scalp contusion. CT CERVICAL SPINE FINDINGS Alignment: No acute subluxation. Skull base and vertebrae: No acute fracture.  Osteopenia. Soft tissues and spinal canal: No prevertebral fluid or swelling. No visible canal hematoma. Disc levels:  No acute findings.  Degenerative changes. Upper chest: An 11 mm left apical subpleural nodule. Dedicated chest CT is recommended for further evaluation. Other: Mild bilateral carotid bulb calcified plaques. IMPRESSION: 1. Trace right frontal subarachnoid hemorrhage. No mass effect or midline shift. 2. No acute/traumatic cervical spine pathology. 3. An 11 mm left apical subpleural nodule. Dedicated chest CT is recommended for further evaluation. These results were called by telephone at the time of interpretation on 08/17/2023 at 8:25 pm to DR [facility], who verbally acknowledged these results. Electronically Signed   By: Elgie Collard M.D.   On: 08/17/2023 20:27   DG Pelvis Portable Result Date: 08/17/2023 CLINICAL DATA:  Larey Seat EXAM: PORTABLE PELVIS 1-2 VIEWS COMPARISON:  04/16/2015 FINDINGS: Supine frontal view of the pelvis excludes portions of the proximal right femur due to collimation. Prior ORIF of the right femoral neck. No evidence of acute fracture, subluxation, or dislocation. Mild symmetrical bilateral hip osteoarthritis. IMPRESSION: 1. No acute displaced fracture. Electronically Signed   By: Sharlet Salina M.D.   On: 08/17/2023 20:00   DG Chest Port 1 View Result Date: 08/17/2023 CLINICAL DATA:  Larey Seat EXAM: PORTABLE CHEST 1 VIEW COMPARISON:  04/16/2015 FINDINGS: Single frontal view of the chest demonstrates a stable cardiac silhouette, with continued ectasia of the thoracic aorta. No acute airspace disease, effusion, or pneumothorax. There are no acute displaced fractures. IMPRESSION: 1.  Stable chest, no acute process. Electronically Signed   By: Sharlet Salina M.D.   On: 08/17/2023 19:59    Pending Labs Unresulted Labs (From admission, onward)     Start     Ordered   08/18/23 0500  CBC with Differential/Platelet  Tomorrow morning,   R        08/17/23 2115   08/18/23 0500  Comprehensive metabolic panel  Tomorrow morning,   R        08/17/23 2115   08/18/23 0500  Magnesium  Tomorrow morning,   R        08/17/23 2115   08/17/23 2115  Magnesium  Add-on,   AD        08/17/23 2115            Vitals/Pain Today's Vitals   08/17/23 2003 08/17/23 2031 08/17/23 2055 08/17/23 2057  BP:   (!) 186/94 (!) 155/71  Pulse:   72 68  Resp:   18 17  Temp:   97.9 F (36.6 C)   TempSrc:   Oral   SpO2:   98% 99%  Weight:      Height:      PainSc: 5  3       Isolation Precautions No active isolations  Medications Medications  Tdap (BOOSTRIX) injection 0.5 mL (has no administration in time range)  melatonin tablet 3 mg (has no administration in time range)  ondansetron (ZOFRAN) injection 4 mg (has no administration in time range)  acetaminophen (TYLENOL) tablet 1,000 mg (has no administration in time range)    Or  acetaminophen (TYLENOL) suppository 650 mg (has no administration in time range)  lactated ringers bolus 1,000 mL (0 mLs Intravenous Stopped 08/17/23 2059)  meclizine (ANTIVERT) tablet 25 mg (25 mg Oral Given 08/17/23 1947)  ondansetron (ZOFRAN) injection 4 mg (4 mg Intravenous Given 08/17/23 1944)  morphine (PF) 2 MG/ML injection 2 mg (2 mg Intravenous Given 08/17/23 1944)    Mobility walks     Focused Assessments Neuro Assessment Handoff:  Swallow screen pass? Yes  Cardiac Rhythm: Normal sinus rhythm       Neuro Assessment: Exceptions to WDL Neuro Checks:      Has TPA been given? No If patient is a Neuro Trauma and patient is going to OR before floor call report to 4N Charge nurse: [phone] or [phone]   R Recommendations: See  Admitting Provider Note  Report given to:   Additional Notes: Fall from home. Not on anticoagulants. Suspected fall from ~4 steps outside of house. No memory of event. GCS-15 on arrival. Very dizzy on arrival. Meclizine given with no improvement. Consult to neurosurgery. Q4H neuro check.

## 2023-08-17 NOTE — ED Notes (Signed)
Trauma Response Nurse Documentation   Diane Shea is a 72 y.o. female arriving to [facility] ED via [facility]  On No antithrombotic. Trauma was activated as a Level 2 by Victorino Dike, Charge based on the following trauma criteria GCS 10-14 associated with trauma or AVPU < A.  Patient cleared for CT by Dr. Silverio Lay. Pt transported to CT with trauma response nurse present to monitor. RN remained with the patient throughout their absence from the department for clinical observation.   GCS 15.  Trauma MD Arrival Time: N/A.  History   Past Medical History:  Diagnosis Date   Breast cancer (HCC) 2003   left breast cancer, radiation   Cancer (HCC) 2003   Breast   Encounter for screening colonoscopy 04/15/2013   Graves disease    Hypertension 2006   Migraines    Personal history of radiation therapy      Past Surgical History:  Procedure Laterality Date   ABDOMINAL HYSTERECTOMY  2004   BREAST EXCISIONAL BIOPSY Left 2003   sentinel node   BREAST LUMPECTOMY Left 2003   BREAST SURGERY Left 2003   mammosite placement   COLONOSCOPY  2004   CYST EXCISION  2009   vocal cord   FOOT SURGERY     HIP PINNING,CANNULATED Right 04/16/2015   Procedure: CANNULATED HIP PINNING;  Surgeon: Donato Heinz, MD;  Location: [facility] ORS;  Service: Orthopedics;  Laterality: Right;   REPLACEMENT TOTAL KNEE Left 2013       Initial Focused Assessment (If applicable, or please see trauma documentation): Airway-- intact, no visible obstruction Breathing-- spontaneous, unlabored Circulation-- laceration to back of head, bleeding controlled on arrival  CT's Completed:   CT Head and CT C-Spine   Interventions:  See event note  Plan for disposition:  Admission to floor   Consults completed:  none at 2305.  Event Summary: Patient brought in by [facility]. Patient with a fall down 8 steps this evening with +LOC. On arrival, patient alert and oriented x4, GCS 15. Manual BP obtained, lab  work obtained. Xray chest and pelvis completed. Morphine, meclizine, zofran, 1 L lactated ringers administered. Patient to CT with TRN. CT head and c-spine completed.  MTP Summary (If applicable):  N/A  Bedside handoff with ED RN Riki Rusk.    Leota Sauers  Trauma Response RN  Please call TRN at [phone] for further assistance.

## 2023-08-17 NOTE — Progress Notes (Signed)
Orthopedic Tech Progress Note Patient Details:  Diane Shea [birth year] [id number]  Responded to level 2 trauma, not needed at this time  Patient ID: Laverle Patter, female   DOB: [birth year], 72 y.o.   MRN: [id number]  Diannia Ruder 08/17/2023, 7:38 PM

## 2023-08-17 NOTE — ED Notes (Addendum)
Patient transported to CT on cardiac monitor via Trn

## 2023-08-17 NOTE — ED Notes (Addendum)
Central telemetry notified for cardiac monitoring.

## 2023-08-18 ENCOUNTER — Observation Stay (HOSPITAL_COMMUNITY): Payer: Medicare PPO

## 2023-08-18 DIAGNOSIS — S066X1A Traumatic subarachnoid hemorrhage with loss of consciousness of 30 minutes or less, initial encounter: Secondary | ICD-10-CM | POA: Diagnosis not present

## 2023-08-18 DIAGNOSIS — R42 Dizziness and giddiness: Secondary | ICD-10-CM | POA: Diagnosis not present

## 2023-08-18 LAB — CBC WITH DIFFERENTIAL/PLATELET
Abs Immature Granulocytes: 0.04 10*3/uL (ref 0.00–0.07)
Basophils Absolute: 0 10*3/uL (ref 0.0–0.1)
Basophils Relative: 0 %
Eosinophils Absolute: 0 10*3/uL (ref 0.0–0.5)
Eosinophils Relative: 0 %
HCT: 37.8 % (ref 36.0–46.0)
Hemoglobin: 13.1 g/dL (ref 12.0–15.0)
Immature Granulocytes: 0 %
Lymphocytes Relative: 11 %
Lymphs Abs: 1.2 10*3/uL (ref 0.7–4.0)
MCH: 31.6 pg (ref 26.0–34.0)
MCHC: 34.7 g/dL (ref 30.0–36.0)
MCV: 91.3 fL (ref 80.0–100.0)
Monocytes Absolute: 0.5 10*3/uL (ref 0.1–1.0)
Monocytes Relative: 5 %
Neutro Abs: 8.7 10*3/uL — ABNORMAL HIGH (ref 1.7–7.7)
Neutrophils Relative %: 84 %
Platelets: 255 10*3/uL (ref 150–400)
RBC: 4.14 MIL/uL (ref 3.87–5.11)
RDW: 13.4 % (ref 11.5–15.5)
WBC: 10.5 10*3/uL (ref 4.0–10.5)
nRBC: 0 % (ref 0.0–0.2)

## 2023-08-18 LAB — COMPREHENSIVE METABOLIC PANEL
ALT: 23 U/L (ref 0–44)
AST: 21 U/L (ref 15–41)
Albumin: 3.1 g/dL — ABNORMAL LOW (ref 3.5–5.0)
Alkaline Phosphatase: 60 U/L (ref 38–126)
Anion gap: 8 (ref 5–15)
BUN: 9 mg/dL (ref 8–23)
CO2: 20 mmol/L — ABNORMAL LOW (ref 22–32)
Calcium: 8.7 mg/dL — ABNORMAL LOW (ref 8.9–10.3)
Chloride: 107 mmol/L (ref 98–111)
Creatinine, Ser: 0.6 mg/dL (ref 0.44–1.00)
GFR, Estimated: >60 mL/min (ref >60)
Glucose, Bld: 143 mg/dL — ABNORMAL HIGH (ref 70–99)
Potassium: 3.4 mmol/L — ABNORMAL LOW (ref 3.5–5.1)
Sodium: 135 mmol/L (ref 135–145)
Total Bilirubin: 1.1 mg/dL (ref <1.2)
Total Protein: 5.4 g/dL — ABNORMAL LOW (ref 6.5–8.1)

## 2023-08-18 LAB — MAGNESIUM: Magnesium: 1.8 mg/dL (ref 1.7–2.4)

## 2023-08-18 LAB — PROTIME-INR
INR: 1 (ref 0.8–1.2)
Prothrombin Time: 13.1 s (ref 11.4–15.2)

## 2023-08-18 LAB — APTT: aPTT: 26 s (ref 24–36)

## 2023-08-18 MED ORDER — SODIUM CHLORIDE 0.9 % IV SOLN
12.5000 mg | Freq: Four times a day (QID) | INTRAVENOUS | Status: DC | PRN
  Filled 2023-08-18: qty 0.5

## 2023-08-18 MED ORDER — POTASSIUM CHLORIDE CRYS ER 20 MEQ PO TBCR
60.0000 meq | EXTENDED_RELEASE_TABLET | Freq: Once | ORAL | Status: AC
  Administered 2023-08-18: 60 meq via ORAL
  Filled 2023-08-18: qty 3

## 2023-08-18 MED ORDER — IOHEXOL 350 MG/ML SOLN
50.0000 mL | Freq: Once | INTRAVENOUS | Status: AC | PRN
  Administered 2023-08-18: 50 mL via INTRAVENOUS

## 2023-08-18 NOTE — Evaluation (Signed)
Physical Therapy Evaluation Patient Details Name: Diane Shea MRN: [id number] DOB: 05-15-1951 Today's Date: 08/18/2023  History of Present Illness  Pt is 72 yo presenting to [facility] ED via EMS post fall down 4 steps with LOC. Pt is reporting dizziness after all. Imaging shows small SAH; per neurology no intervention necessary at this time. PMH: Breast cx, graves disease, HTN, migraines  Clinical Impression  Pt is presenting below baseline level of functioning. Currently pt is CGA for bed mobility, sit to stand and gait with AD. Prior pt was ind with mobility. Pt was positive for Dix-Hallpike maneuver on the L which resulted in significant vertigo and emesis. Pt was able to tolerate Epley maneuver on the L and reports decreased headache after maneuver. Pt was educated on instructions following L Epley maneuver including sleeping at 45 degrees or on 2 pillows for 2 nights, no sleeping on the L side or extreme head movements for 1 week. Due to feelings of improvement following Epley and severity of symptoms did not re-test today. Pt educated on the vestibular system and criteria for rest in the context of recent SAH. Due to pt current functional status, home set up and available assistance at home recommending skilled physical therapy services 3x/day in order to address vertigo and functional mobility to decrease risk for falls, injury, immobility.          If plan is discharge home, recommend the following: Assistance with cooking/housework;Assist for transportation;Help with stairs or ramp for entrance;A little help with walking and/or transfers     Equipment Recommendations None recommended by PT     Functional Status Assessment Patient has had a recent decline in their functional status and demonstrates the ability to make significant improvements in function in a reasonable and predictable amount of time.     Precautions / Restrictions Precautions Precautions: Fall Restrictions Weight  Bearing Restrictions Per Provider Order: No      Mobility  Bed Mobility Overal bed mobility: Needs Assistance Bed Mobility: Supine to Sit, Sit to Supine, Rolling Rolling: Contact guard assist   Supine to sit: Min assist Sit to supine: Supervision   General bed mobility comments: rolling R/L for Epley Maneuver at CGA with assist for stabilizing head and Min A for supine to sitting from side lying during Epley maneuver.    Transfers Overall transfer level: Needs assistance Equipment used: 1 person hand held assist Transfers: Sit to/from Stand Sit to Stand: Contact guard assist      Ambulation/Gait     General Gait Details: not focus of session today.    Vestibular Assessment - 08/18/23 0001       Symptom Behavior   Frequency of Dizziness with movement    Duration of Dizziness since fall      Oculomotor Exam   Oculomotor Alignment Normal    Ocular ROM WNL    Head shaking Horizontal Absent    Head Shaking Vertical Absent    Smooth Pursuits Intact    Saccades Intact      Oculomotor Exam-Fixation Suppressed    Ocular Alignment WNL    Ocular ROM WNL    Spontaneous Nystagmus Absent    Gaze evoked nystagmus Absent    Left Head Impulse Mild nystagmus    Right Head Impulse Negative    Head Shaking Nystagmus-Horizontal Slight increase in symptoms      Vestibulo-Ocular Reflex   VOR 1 Head Only (x 1 viewing) Negative    VOR 2 Head and Object (x 2 viewing) Negative  VOR to Slow Head Movement Negative left;Negative right    VOR Cancellation Normal      Positional Testing   Dix-Hallpike Dix-Hallpike Left      Dix-Hallpike Left   Dix-Hallpike Left Duration initially 1 min then 30 seconds    Dix-Hallpike Left Symptoms Downbeat, left rotatory nystagmus      Cognition   Cognition Orientation Level Oriented x 4               Balance Overall balance assessment: Mild deficits observed, not formally tested         Pertinent Vitals/Pain Pain Assessment Pain  Assessment: 0-10 Pain Score: 2  Pain Location: headache Pain Descriptors / Indicators: Aching Pain Intervention(s): Monitored during session, Limited activity within patient's tolerance    Home Living Family/patient expects to be discharged to:: Private residence Living Arrangements: Spouse/significant other Available Help at Discharge: Available 24 hours/day Type of Home: House Home Access: Stairs to enter Entrance Stairs-Rails: Left Entrance Stairs-Number of Steps: 4   Home Layout: One level Home Equipment: Agricultural consultant (2 wheels);BSC/3in1;Shower seat;Cane - single point      Prior Function Prior Level of Function : Independent/Modified Independent;Working/employed;Driving     Mobility Comments: Ind without AD ADLs Comments: 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     Extremity/Trunk Assessment   Upper Extremity Assessment Upper Extremity Assessment: Overall WFL for tasks assessed    Lower Extremity Assessment Lower Extremity Assessment: Overall WFL for tasks assessed    Cervical / Trunk Assessment Cervical / Trunk Assessment: Normal  Communication   Communication Communication: No apparent difficulties  Cognition Arousal: Alert Behavior During Therapy: WFL for tasks assessed/performed Overall Cognitive Status: Within Functional Limits for tasks assessed        General Comments General comments (skin integrity, edema, etc.): open skin level area on the posterior craniumwith some drainage.        Assessment/Plan    PT Assessment Patient needs continued PT services  PT Problem List Decreased balance;Decreased activity tolerance;Decreased mobility;Other (comment) (L BPPV posterior canal)       PT Treatment Interventions DME instruction;Functional mobility training;Patient/family education;Gait training;Therapeutic activities;Stair training;Therapeutic exercise;Balance training    PT Goals (Current goals can be found in the Care Plan section)  Acute  Rehab PT Goals Patient Stated Goal: improve symptoms PT Goal Formulation: With patient Time For Goal Achievement: 09/01/23 Potential to Achieve Goals: Good Additional Goals Additional Goal #1: Report no dizziness with Dix-Hallpike maneuver to the L with no symptoms/signs of vertigo    Frequency Min 1X/week        AM-PAC PT "6 Clicks" Mobility  Outcome Measure Help needed turning from your back to your side while in a flat bed without using bedrails?: A Little Help needed moving from lying on your back to sitting on the side of a flat bed without using bedrails?: A Little Help needed moving to and from a bed to a chair (including a wheelchair)?: A Little Help needed standing up from a chair using your arms (e.g., wheelchair or bedside chair)?: A Little Help needed to walk in hospital room?: A Little Help needed climbing 3-5 steps with a railing? : A Little 6 Click Score: 18    End of Session   Activity Tolerance: Patient tolerated treatment well Patient left: in bed;with call bell/phone within reach;with bed alarm set Nurse Communication: Mobility status PT Visit Diagnosis: Other abnormalities of gait and mobility (R26.89);Muscle weakness (generalized) (M62.81)    Time: 9562-1308 PT Time Calculation (  min) (ACUTE ONLY): 76 min   Charges:   PT Evaluation $PT Eval Low Complexity: 1 Low PT Treatments $Therapeutic Activity: 38-52 mins $Canalith Rep Proc: 8-22 mins PT General Charges $$ ACUTE PT VISIT: 1 Visit         Harrel Carina, DPT, CLT  Acute Rehabilitation Services Office: [phone] (Secure chat preferred)   Claudia Desanctis 08/18/2023, 12:40 PM

## 2023-08-18 NOTE — Care Management Obs Status (Signed)
MEDICARE OBSERVATION STATUS NOTIFICATION   Patient Details  Name: Diane Shea MRN: [id number] Date of Birth: 1951/04/06   Medicare Observation Status Notification Given:  Yes    Kingsley Plan, RN 08/18/2023, 1:17 PM

## 2023-08-18 NOTE — Hospital Course (Signed)
72 y.o. female with medical history significant for migraines, acquired hypothyroidism, who is admitted to [facility] on 08/17/2023 with residual dizziness after mechanical fall after presenting from home to [facility] ED complaining of fall.

## 2023-08-18 NOTE — Progress Notes (Signed)
   08/18/23 1200  Spiritual Encounters  Type of Visit Initial  Care provided to: Patient  Conversation partners present during encounter Nurse  Referral source Patient request  Reason for visit Advance directives  OnCall Visit No   Chaplain attempted to visit patient but she was unavailable. Chaplain will follow-up.

## 2023-08-18 NOTE — Progress Notes (Signed)
   08/18/23 1400  Spiritual Encounters  Type of Visit Follow up  Care provided to: [facility] partners present during encounter Nurse  Referral source Patient request  Reason for visit Advance directives  OnCall Visit No   Chaplain responded to request for AD. Patient was unavailable, but her husband was present at bedside. Chaplain left a copy of AD with spouse. Patient will page chaplain when she is available. No follow-up needed at this time.

## 2023-08-18 NOTE — Progress Notes (Signed)
DC instructions, med list, follow up care, and written info [id number], subdural hemmorrhage, and head injury reviewed with pt and SO.  Both verbalzied understanding. Tele and IV removed.  Scalp laceration cleansed with warm soapy water prior to discharge.  Pt discharged via wheelchair without incident

## 2023-08-18 NOTE — Progress Notes (Signed)
Transition of Care [facility]) - CAGE-AID Screening   Patient Details  Name: Diane Shea MRN: [id number] Date of Birth: 05/20/1951  Transition of Care [facility]) CM/SW Contact:    Leota Sauers, RN Phone Number: 08/18/2023, 6:39 AM   Clinical Narrative:  Patient denies use of alcohol and illicit substances. Resources not given at this time.   CAGE-AID Screening:    Have You Ever Felt You Ought to Cut Down on Your Drinking or Drug Use?: No Have People Annoyed You By Critizing Your Drinking Or Drug Use?: No Have You Felt Bad Or Guilty About Your Drinking Or Drug Use?: No Have You Ever Had a Drink or Used Drugs First Thing In The Morning to Steady Your Nerves or to Get Rid of a Hangover?: No CAGE-AID Score: 0  Substance Abuse Education Offered: No

## 2023-08-18 NOTE — TOC Initial Note (Signed)
Transition of Care (TOC) - Initial/Assessment Note   Spoke to patient at bedside. Patient from home with spouse.   PT recommending OP PT vestibular . Offered choice. Patient prefers OP PT at [facility] . NCM entered referral. Information on AVS. Secure chatted MD to sign order  Patient Details  Name: Diane Shea MRN: [id number] Date of Birth: July 15, 1951  Transition of Care [facility]) CM/SW Contact:    Kingsley Plan, RN Phone Number: 08/18/2023, 1:19 PM  Clinical Narrative:                   Expected Discharge Plan: Home/Self Care Barriers to Discharge: Continued Medical Work up   Patient Goals and CMS Choice Patient states their goals for this hospitalization and ongoing recovery are:: to return to home          Expected Discharge Plan and Services   Discharge Planning Services: CM Consult   Living arrangements for the past 2 months: Single Family Home                 DME Arranged: N/A         HH Arranged: NA HH Agency: NA        Prior Living Arrangements/Services Living arrangements for the past 2 months: Single Family Home Lives with:: Spouse Patient language and need for interpreter reviewed:: Yes Do you feel safe going back to the place where you live?: Yes      Need for Family Participation in Patient Care: Yes (Comment) Care giver support system in place?: Yes (comment) Current home services: DME Criminal Activity/Legal Involvement Pertinent to Current Situation/Hospitalization: No - Comment as needed  Activities of Daily Living   ADL Screening (condition at time of admission) Independently performs ADLs?: Yes (appropriate for developmental age) Is the patient deaf or have difficulty hearing?: No Does the patient have difficulty seeing, even when wearing glasses/contacts?: No Does the patient have difficulty concentrating, remembering, or making decisions?: No  Permission Sought/Granted   Permission granted to share information with : No               Emotional Assessment Appearance:: Appears stated age Attitude/Demeanor/Rapport: Engaged Affect (typically observed): Accepting Orientation: : Oriented to Self, Oriented to Place, Oriented to  Time, Oriented to Situation Alcohol / Substance Use: Not Applicable Psych Involvement: No (comment)  Admission diagnosis:  Dizziness [R42] Patient Active Problem List   Diagnosis Date Noted   Dizziness 08/17/2023   Fall 08/17/2023   SAH (subarachnoid hemorrhage) (HCC) 08/17/2023   Pulmonary nodule 08/17/2023   Acquired hypothyroidism 08/17/2023   Headache 08/17/2023   Hip fracture, right (HCC) 04/15/2015   Hip fracture (HCC) 04/15/2015   Encounter for screening colonoscopy 04/15/2013   PCP:  Lauro Regulus, MD Pharmacy:   [id number] - [place], [place] - [address] AT [facility] [address] [place] [address] [place] [address] Phone: [phone] Fax: [phone]     Social Drivers of Health (SDOH) Social History: SDOH Screenings   Food Insecurity: No Food Insecurity (08/17/2023)  Housing: Low Risk  (08/17/2023)  Transportation Needs: No Transportation Needs (08/17/2023)  Utilities: Not At Risk (08/17/2023)  Financial Resource Strain: Low Risk  (11/29/2022)   Received from [facility]  Tobacco Use: Low Risk  (08/17/2023)   SDOH Interventions:     Readmission Risk Interventions     No data to display

## 2023-08-18 NOTE — Discharge Summary (Signed)
Physician Discharge Summary   Patient: Diane Shea MRN: [id number] DOB: Dec 31, 1950  Admit date:     08/17/2023  Discharge date: 08/18/23  Discharge Physician: Rickey Barbara   PCP: Lauro Regulus, MD   Recommendations at discharge:    Follow up with PCP in 2-3 weeks Follow up with Neurosurgery as needed  Discharge Diagnoses: Principal Problem:   Dizziness Active Problems:   [facility] (subarachnoid hemorrhage) (HCC)   Pulmonary nodule   Acquired hypothyroidism   Headache  Resolved Problems:   * No resolved hospital problems. *  Hospital Course: 72 y.o. female with medical history significant for migraines, acquired hypothyroidism, who is admitted to [facility] on 08/17/2023 with residual dizziness after mechanical fall after presenting from home to [facility] ED complaining of fall.   Assessment and Plan: #) Dizziness with vertigo -Pt seen by PT this visit. Had positive Dix-Hallpike.  -Pt responded well to Epley maneuver -Improved -Outpt PT recommended  # (Mechanical fall: - seen by PT with recs for outpt PT    #) Trace right frontal subarachnoid hemorrhage:  -EDP discussed patient's case with on-call neurosurgery, who after reviewing case as well as imaging, did not feel that any additional evaluation or management of the very small SAH was warranted at this time.  Specifically, they did not feel that a repeat CT head was indicated and that outpatient follow-up in NS was not warranted.  -Pt remained stable this visit -Have provided contact for Neurosurgery as needed  #) Pulmonary nodule:  -Incidental 11mm L upper lobe nodule on CT cervical spine.  -LUL lesion on dedicated CT chest is more consistent with scar -Pt does have incidental 5mm L lower lobe nodule. Recommendation for repeat CT chest in 3 months    #) acquired hypothyroidism:  -Continued on thyroid replacement    Consultants: EDP discussed with Neurosurgeon in ED Procedures performed:    Disposition: Home Diet recommendation:  Regular diet DISCHARGE MEDICATION: Allergies as of 08/18/2023       Reactions   Sulfa Antibiotics    Ibandronic Acid Other (See Comments)   Arthralgia's        Medication List     STOP taking these medications    enoxaparin 40 MG/0.4ML injection Commonly known as: LOVENOX   oxyCODONE 5 MG immediate release tablet Commonly known as: Oxy IR/ROXICODONE       TAKE these medications    ALPHA LIPOIC ACID PO Take 1 capsule by mouth at bedtime.   calcium carbonate 600 MG Tabs tablet Commonly known as: OS-CAL Take 600 mg by mouth 2 (two) times daily.   cholecalciferol 1000 units tablet Commonly known as: VITAMIN D Take 1,000 Units by mouth daily.   DULoxetine 60 MG capsule Commonly known as: CYMBALTA Take 60 mg by mouth daily.   levothyroxine 112 MCG tablet Commonly known as: SYNTHROID Take 112 mcg by mouth daily before breakfast.   magnesium oxide 400 (240 Mg) MG tablet Commonly known as: MAG-OX Take 400 mg by mouth every evening.   multivitamin with minerals tablet Take 1 tablet by mouth daily.   omega-3 acid ethyl esters 1 g capsule Commonly known as: LOVAZA Take 1 g by mouth daily.   SUMAtriptan 100 MG tablet Commonly known as: IMITREX Take 1 tablet by mouth every 2 (two) hours as needed for migraine.   topiramate 50 MG tablet Commonly known as: TOPAMAX Take 50 mg by mouth daily.        Follow-up Information  Outpatient PT at [facility] Follow up.   Contact information: [address] [place], [place] [address]  phone [phone]        Lisbeth Renshaw, MD Follow up.   Specialty: Neurosurgery Why: As needed Contact information: [address] [place] [address] [phone]         Lauro Regulus, MD Follow up in 3 week(s).   Specialty: Internal Medicine Why: Hospital follow up Contact information: [address] [facility] [place] [address] [phone]                Discharge Exam: Ceasar Mons Weights   08/17/23 1935 08/17/23 2310 08/18/23 0517  Weight: 72.6 kg 81.2 kg 81.6 kg   General exam: Awake, laying in bed, in nad Respiratory system: Normal respiratory effort, no wheezing Cardiovascular system: regular rate, s1, s2 Gastrointestinal system: Soft, nondistended, positive BS Central nervous system: CN2-12 grossly intact, strength intact Extremities: Perfused, no clubbing Skin: Normal skin turgor, no notable skin lesions seen Psychiatry: Mood normal // no visual hallucinations   Condition at discharge: fair  The results of significant diagnostics from this hospitalization (including imaging, microbiology, ancillary and laboratory) are listed below for reference.   Imaging Studies: CT CHEST W CONTRAST Result Date: 08/18/2023 CLINICAL DATA:  "Lung nodule". EXAM: CT CHEST WITH CONTRAST TECHNIQUE: Multidetector CT imaging of the chest was performed during intravenous contrast administration. RADIATION DOSE REDUCTION: This exam was performed according to the departmental dose-optimization program which includes automated exposure control, adjustment of the mA and/or kV according to patient size and/or use of iterative reconstruction technique. CONTRAST:  50mL OMNIPAQUE IOHEXOL 350 MG/ML SOLN COMPARISON:  Chest radiograph 08/17/2023. No nodule identified. 02/07/2002 chest CT report reviewed. Images not available. FINDINGS: Cardiovascular: Aortic atherosclerosis. Tortuous thoracic aorta. Mild cardiomegaly, without pericardial effusion. No central pulmonary embolism, on this non-dedicated study. Mediastinum/Nodes: Left axillary node dissection. No axillary adenopathy. No mediastinal or hilar adenopathy. No internal mammary adenopathy. Lungs/Pleura: No pleural fluid. Density at the left apex measures 9 mm on 19/7 and coronal image 65, favored to represent asymmetric pleuroparenchymal scarring. Pleural-based  left lower lobe 5 mm nodule on 108/7. Upper Abdomen: Normal imaged portions of the liver, spleen, stomach, pancreas, right adrenal gland, left kidney. Mild left adrenal thickening. Musculoskeletal: Peripherally calcified left breast lesion may be the sequelae of prior left-sided lumpectomy at 1.3 cm. T6 sclerotic lesion is most likely a bone island 7 mm. Lower thoracic spondylosis. IMPRESSION: 1. No dominant lung nodule identified. Please note that no comparison exam or report to identify the site of reported nodule available/submitted. 2. Left apical density is favored to represent asymmetric pleuroparenchymal scarring. Recommend chest CT follow-up at 3 months. Alternatively, if there are prior chest CTs for comparison, these could be reviewed. 3. A left lower lobe 5 mm pulmonary nodule can be re-evaluated on follow-up exam. 4.  Aortic Atherosclerosis (ICD10-I70.0). Electronically Signed   By: Jeronimo Greaves M.D.   On: 08/18/2023 16:29   CT HEAD WO CONTRAST ( ) Result Date: 08/17/2023 CLINICAL DATA:  Trauma.  Fall. EXAM: CT HEAD WITHOUT CONTRAST CT CERVICAL SPINE WITHOUT CONTRAST TECHNIQUE: Multidetector CT imaging of the head and cervical spine was performed following the standard protocol without intravenous contrast. Multiplanar CT image reconstructions of the cervical spine were also generated. RADIATION DOSE REDUCTION: This exam was performed according to the departmental dose-optimization program which includes automated exposure control, adjustment of the mA and/or kV according to patient size  and/or use of iterative reconstruction technique. COMPARISON:  None Available. FINDINGS: CT HEAD FINDINGS Brain: Mild age-related atrophy. The gray-white matter discrimination is preserved. Faint minimal high attenuating intra sulcal density in the right frontal lobe (25-26/3) most consistent with trace subarachnoid hemorrhage. No mass effect or midline shift. Vascular: No hyperdense vessel or unexpected  calcification. Skull: Normal. Negative for fracture or focal lesion. Sinuses/Orbits: No acute finding. Other: Mild left posterior parietal scalp contusion. CT CERVICAL SPINE FINDINGS Alignment: No acute subluxation. Skull base and vertebrae: No acute fracture.  Osteopenia. Soft tissues and spinal canal: No prevertebral fluid or swelling. No visible canal hematoma. Disc levels:  No acute findings.  Degenerative changes. Upper chest: An 11 mm left apical subpleural nodule. Dedicated chest CT is recommended for further evaluation. Other: Mild bilateral carotid bulb calcified plaques. IMPRESSION: 1. Trace right frontal subarachnoid hemorrhage. No mass effect or midline shift. 2. No acute/traumatic cervical spine pathology. 3. An 11 mm left apical subpleural nodule. Dedicated chest CT is recommended for further evaluation. These results were called by telephone at the time of interpretation on 08/17/2023 at 8:25 pm to DR [facility], who verbally acknowledged these results. Electronically Signed   By: Elgie Collard M.D.   On: 08/17/2023 20:27   CT Cervical Spine Wo Contrast Result Date: 08/17/2023 CLINICAL DATA:  Trauma.  Fall. EXAM: CT HEAD WITHOUT CONTRAST CT CERVICAL SPINE WITHOUT CONTRAST TECHNIQUE: Multidetector CT imaging of the head and cervical spine was performed following the standard protocol without intravenous contrast. Multiplanar CT image reconstructions of the cervical spine were also generated. RADIATION DOSE REDUCTION: This exam was performed according to the departmental dose-optimization program which includes automated exposure control, adjustment of the mA and/or kV according to patient size and/or use of iterative reconstruction technique. COMPARISON:  None Available. FINDINGS: CT HEAD FINDINGS Brain: Mild age-related atrophy. The gray-white matter discrimination is preserved. Faint minimal high attenuating intra sulcal density in the right frontal lobe (25-26/3) most consistent with trace  subarachnoid hemorrhage. No mass effect or midline shift. Vascular: No hyperdense vessel or unexpected calcification. Skull: Normal. Negative for fracture or focal lesion. Sinuses/Orbits: No acute finding. Other: Mild left posterior parietal scalp contusion. CT CERVICAL SPINE FINDINGS Alignment: No acute subluxation. Skull base and vertebrae: No acute fracture.  Osteopenia. Soft tissues and spinal canal: No prevertebral fluid or swelling. No visible canal hematoma. Disc levels:  No acute findings.  Degenerative changes. Upper chest: An 11 mm left apical subpleural nodule. Dedicated chest CT is recommended for further evaluation. Other: Mild bilateral carotid bulb calcified plaques. IMPRESSION: 1. Trace right frontal subarachnoid hemorrhage. No mass effect or midline shift. 2. No acute/traumatic cervical spine pathology. 3. An 11 mm left apical subpleural nodule. Dedicated chest CT is recommended for further evaluation. These results were called by telephone at the time of interpretation on 08/17/2023 at 8:25 pm to DR [facility], who verbally acknowledged these results. Electronically Signed   By: Elgie Collard M.D.   On: 08/17/2023 20:27   DG Pelvis Portable Result Date: 08/17/2023 CLINICAL DATA:  Larey Seat EXAM: PORTABLE PELVIS 1-2 VIEWS COMPARISON:  04/16/2015 FINDINGS: Supine frontal view of the pelvis excludes portions of the proximal right femur due to collimation. Prior ORIF of the right femoral neck. No evidence of acute fracture, subluxation, or dislocation. Mild symmetrical bilateral hip osteoarthritis. IMPRESSION: 1. No acute displaced fracture. Electronically Signed   By: Sharlet Salina M.D.   On: 08/17/2023 20:00   DG Chest Port 1 View Result Date: 08/17/2023 CLINICAL DATA:  Fell EXAM: PORTABLE CHEST 1 VIEW COMPARISON:  04/16/2015 FINDINGS: Single frontal view of the chest demonstrates a stable cardiac silhouette, with continued ectasia of the thoracic aorta. No acute airspace disease, effusion, or  pneumothorax. There are no acute displaced fractures. IMPRESSION: 1. Stable chest, no acute process. Electronically Signed   By: Sharlet Salina M.D.   On: 08/17/2023 19:59    Microbiology: Results for orders placed or performed in visit on 09/12/19  Novel Coronavirus, NAA ([facility])     Status: None   Collection Time: 09/12/19  8:22 AM   Specimen: Nasopharyngeal(NP) swabs in vial transport medium   NASOPHARYNGE  TESTING  Result Value Ref Range Status   SARS-CoV-2, NAA Not Detected Not Detected Final    Comment: This nucleic acid amplification test was developed and its performance characteristics determined by [employer]. Nucleic acid amplification tests include PCR and TMA. This test has not been FDA cleared or approved. This test has been authorized by FDA under an Emergency Use Authorization (EUA). This test is only authorized for the duration of time the declaration that circumstances exist justifying the authorization of the emergency use of in vitro diagnostic tests for detection of SARS-CoV-2 virus and/or diagnosis of COVID-19 infection under section 564(b)(1) of the Act, 21 U.S.C. 161WRU-0(A) (1), unless the authorization is terminated or revoked sooner. When diagnostic testing is negative, the possibility of a false negative result should be considered in the context of a patient's recent exposures and the presence of clinical signs and symptoms consistent with COVID-19. An individual without symptoms of COVID-19 and who is not shedding SARS-CoV-2 virus would  expect to have a negative (not detected) result in this assay.     Labs: CBC: Recent Labs  Lab 08/17/23 1948 08/18/23 0035  WBC 8.6 10.5  NEUTROABS 5.5 8.7*  HGB 14.0 13.1  HCT 41.3 37.8  MCV 93.2 91.3  PLT 268 255   Basic Metabolic Panel: Recent Labs  Lab 08/17/23 1948 08/18/23 0035  NA 135 135  K 3.5 3.4*  CL 108 107  CO2 19* 20*  GLUCOSE 101* 143*  BUN 11 9  CREATININE 0.68 0.60   CALCIUM 9.1 8.7*  MG 2.1 1.8   Liver Function Tests: Recent Labs  Lab 08/17/23 1948 08/18/23 0035  AST 22 21  ALT 25 23  ALKPHOS 59 60  BILITOT 0.9 1.1  PROT 6.1* 5.4*  ALBUMIN 3.6 3.1*   CBG: No results for input(s): "GLUCAP" in the last 168 hours.  Discharge time spent: less than 30 minutes.  Signed: Rickey Barbara, MD [facility] 08/18/2023

## 2023-08-28 NOTE — Therapy (Signed)
OUTPATIENT PHYSICAL THERAPY VESTIBULAR EVALUATION     Patient Name: Pauletta Risch MRN: [id number] DOB:12-Oct-1950, 72 y.o., female Today's Date: 08/29/2023  END OF SESSION:  PT End of Session - 08/29/23 0852     Visit Number 1    Number of Visits 17    Date for PT Re-Evaluation 10/24/23    PT Start Time 0801    PT Stop Time 0847    PT Time Calculation (min) 46 min    Equipment Utilized During Treatment Gait belt    Activity Tolerance Patient tolerated treatment well    Behavior During Therapy [facility] for tasks assessed/performed             Past Medical History:  Diagnosis Date   Breast cancer (HCC) 2003   left breast cancer, radiation   Cancer (HCC) 2003   Breast   Encounter for screening colonoscopy 04/15/2013   Graves disease    Hypertension 2006   Migraines    Personal history of radiation therapy    Past Surgical History:  Procedure Laterality Date   ABDOMINAL HYSTERECTOMY  2004   BREAST EXCISIONAL BIOPSY Left 2003   sentinel node   BREAST LUMPECTOMY Left 2003   BREAST SURGERY Left 2003   mammosite placement   COLONOSCOPY  2004   CYST EXCISION  2009   vocal cord   FOOT SURGERY     HIP PINNING,CANNULATED Right 04/16/2015   Procedure: CANNULATED HIP PINNING;  Surgeon: Donato Heinz, MD;  Location: [facility] ORS;  Service: Orthopedics;  Laterality: Right;   REPLACEMENT TOTAL KNEE Left 2013   Patient Active Problem List   Diagnosis Date Noted   Dizziness 08/17/2023   Fall 08/17/2023   SAH (subarachnoid hemorrhage) (HCC) 08/17/2023   Pulmonary nodule 08/17/2023   Acquired hypothyroidism 08/17/2023   Headache 08/17/2023   Hip fracture, right (HCC) 04/15/2015   Hip fracture (HCC) 04/15/2015   Encounter for screening colonoscopy 04/15/2013    PCP: Lauro Regulus, MD  REFERRING PROVIDER: Lauro Regulus, MD   REFERRING DIAG: R42 (ICD-10-CM) - Dizziness and giddiness   THERAPY DIAG:  Dizziness and giddiness  Unsteadiness on  feet  Difficulty in walking, not elsewhere classified  ONSET DATE: 08/17/2023  Rationale for Evaluation and Treatment: Rehabilitation  SUBJECTIVE:   SUBJECTIVE STATEMENT: Pt is a pleasant 72 yo female referred to PT for dizziness. Pt admitted to [facility] 12/12-12/13/24 due to dizziness following mechanical fall on stairs with LOC. Pt found to have trace R frontal subarachnoid hemorrhage. Pt has also since been diagnosed with a concussion. While in hospital pt exhibited positive Dix-Hallpike and improved following Epley maneuvers with recommendation for outpatient PT follow-up. Pt reports maneuvers helped and that L & R sides were both treated. After this she reports she was d/c home. She reports no other vertigo episodes like what she was experiencing before, but reports difficulty turning her head occ and that this makes her woozy/unsteady, this is worse with fatigue. She has been sleeping in a recliner/not yet slept flat. Pt seeing family tomorrow for the holidays, lives with spouse, would like to improve her symptoms. No other falls since the 1 that brought her to the hospital. Pt did report neck soreness following hospital stay, but OK now.   Pt accompanied by: self  PERTINENT HISTORY: Pt is a pleasant 72 yo female referred to PT for dizziness. Pt admitted to [facility] 12/12-12/13/24 due to dizziness following mechanical fall on stairs with LOC. Pt found to have trace  R frontal subarachnoid hemorrhage. Pt has also since been diagnosed with a concussion. While in hospital pt exhibited positive Dix-Hallpike and improved following Epley maneuvers with recommendation for outpatient PT follow-up. Pt reports maneuvers helped and that L & R sides were both treated. After this she reports she was d/c home. She reports no other vertigo episodes like what she was experiencing before, but reports difficulty turning her head occ and that this makes her woozy/unsteady, this is worse with fatigue. She has  been sleeping in a recliner/not yet slept flat. Pt seeing family tomorrow for the holidays, lives with spouse, would like to improve her symptoms. No other falls since the 1 that brought her to the hospital. Pt did report neck soreness following hospital stay, but OK now. PMH significant for migraines, acquired hypothyroidism, breast CA (L breast, radiation, 2003), L breast surgery 2003, TKA L 2013, Graves disease, HTN, see chart for full details  PAIN:  Are you having pain? No  PRECAUTIONS: Fall  RED FLAGS: None   WEIGHT BEARING RESTRICTIONS: No  FALLS: Has patient fallen in last 6 months? Yes. Number of falls 1  LIVING ENVIRONMENT: Lives with: lives with their spouse   PLOF: Independent  PATIENT GOALS: improve her symptoms  OBJECTIVE:  Note: Objective measures were completed at Evaluation unless otherwise noted.  DIAGNOSTIC FINDINGS:    CT Cervical Spine 08/17/2023: "  IMPRESSION: 1. Trace right frontal subarachnoid hemorrhage. No mass effect or midline shift. 2. No acute/traumatic cervical spine pathology. 3. An 11 mm left apical subpleural nodule. Dedicated chest CT is recommended for further evaluation.   These results were called by telephone at the time of interpretation on 08/17/2023 at 8:25 pm to DR [facility], who verbally acknowledged these results."     Electronically Signed   By: Elgie Collard M.D.   On: 08/17/2023 20:27"  CT HEAD 08/17/23:  "IMPRESSION: 1. Trace right frontal subarachnoid hemorrhage. No mass effect or midline shift. 2. No acute/traumatic cervical spine pathology. 3. An 11 mm left apical subpleural nodule. Dedicated chest CT is recommended for further evaluation.   These results were called by telephone at the time of interpretation on 08/17/2023 at 8:25 pm to DR [facility], who verbally acknowledged these results.     Electronically Signed   By: Elgie Collard M.D.   On: 08/17/2023 20:27"  MR BRAIN 06/14/2023: "IMPRESSION: No  acute or reversible finding. No cause of headache identified. Minimal small vessel change of the cerebral hemispheric white matter, less than often seen at this age.     Electronically Signed   By: Paulina Fusi M.D."   On: 06/14/2023 15:04  COGNITION: Overall cognitive status: Within functional limits for tasks assessed   POSTURE:  Very slight crouched posture, possibly due to unsteadiness  Cervical ROM:   Quick cervical AROM screen indicates pt has sufficient pain-free AROM (rotation, ext/flex) needed for vestibular positional testing  STRENGTH: not assessed today  BED MOBILITY:  Currently impaired due to sx (pt not sleeping in bed, she is currently sleeping in recliner)  TRANSFERS: Assistive device utilized: Single point cane  Sit to stand: SBA and CGA Stand to sit: Complete Independence Chair to chair: SBA    GAIT: Gait pattern:  currently guarded due to feelings of unsteadiness, pt using SPC, scanning impaired per pt hx due to unsteadiness with head turns Distance walked: clinic distances Assistive device utilized: Single point cane Level of assistance: Modified independence and CGA   FUNCTIONAL TESTS:  DGI and MCTSIB to  be performed future visit as needed  PATIENT SURVEYS:  FOTO 33 (goal 53)  VESTIBULAR ASSESSMENT:  GENERAL OBSERVATION: cautious gait with SPC due to unsteadiness    SYMPTOM BEHAVIOR:  Subjective history: vertigo s/p fall with concussion, improved after maneuvers provided in hospital to R and L sides (see above)  Non-Vestibular symptoms: headaches and nausea/vomiting  Type of dizziness:  previously vertigo, now more woozy/unsteady, particularly with head turns  Frequency: with particular movements  Relieving factors:  pt not currently lying flat/sleeping in recliner to avoid sx  Progression of symptoms: better since Epley maneuvers provided in hospital  OCULOMOTOR EXAM:  Ocular Alignment: normal  Ocular ROM: No Limitations  Spontaneous  Nystagmus: absent  Gaze-Induced Nystagmus: right beating with right gaze  Smooth Pursuits:  slightly saccadic, possibly WNL for age  Saccades: hypometric/undershoots to L  Convergence/Divergence: WNL   VESTIBULAR - OCULAR REFLEX: deferred ocular reflex testing below due to time  Slow VOR:   VOR Cancellation:   Head-Impulse Test:   Dynamic Visual Acuity:    POSITIONAL TESTING:  Right Dix-Hallpike: POSITIVE, <30 sec duration, delayed onset, R torsional and upbeating nystagmus  Repeated following Epley (see below): still positive, but pt reports dizziness less intense, nystagmus is also less intense in appearance                                                                                                                             TREATMENT DATE:   CRM: Epley maneuver provided 2x to treat R side for 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 Pt reports improvement in intensity of dizziness with second maneuver, no symptoms upon sitting up with second maneuver.  PT provided pt with informational handouts on concussion and BPPV from the [employer], and reviewed post-maneuver precautions and symptoms. Instructed pt to take things easy for the rest of the day as she will likely feel less steady. Instructed pt (in addition to [facility]) to hold onto spouse when ambulating out of clinic. Pt verbalized understanding for all.  PATIENT EDUCATION: Education details: assessment findings, maneuvers, post-maneuver precautions, plan Person educated: Patient Education method: Explanation and Handouts Education comprehension: verbalized understanding  HOME EXERCISE PROGRAM:   GOALS: Goals reviewed with patient? Yes  SHORT TERM GOALS: Target date: 09/26/2023   Patient will be independent in home exercise program to improve gait, mobility and balance for better functional independence with ADLs. Baseline: to be initiated as needed next 1-2 visits Goal status:  INITIAL   LONG TERM GOALS: Target date: 10/24/2023    Patient will increase FOTO score to equal to or greater than 53  to demonstrate statistically significant improvement in mobility and quality of life.  Baseline: 33 Goal status: INITIAL  2.  Patient will reduce dizziness handicap inventory score to <50, for less dizziness with ADLs and increased safety with home and work tasks.  Baseline:  Goal status: INITIAL  3.  Patient will increase ABC scale score >  80% to demonstrate better functional mobility and better confidence with ADLs.  Baseline:  Goal status: INITIAL  4.  Patient will increase dynamic gait index score to >19/24 as to demonstrate reduced fall risk and improved dynamic gait balance for better safety with community/home ambulation.   Baseline:  Goal status: INITIAL   ASSESSMENT:  CLINICAL IMPRESSION: Patient is a pleasant  72 y.o. female who was seen today for physical therapy evaluation and treatment for dizziness s/p concussion due to mechanical fall. Pt found to have positive R Dix-Hallpike. 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 Pt exhibited good balance with gait out of clinic with [facility] following maneuvers. PT provided instruction on post-maneuver precautions with plan to retest Dix-Hallpike next session as needed. Pt with some abnormal findings on oculomotor exam (see above), but these are likely consistent with recent hx of concussion. The pt will benefit from further skilled PT to improve positionally-triggered dizziness and balance in order to increase QOL and decrease fall risk.  OBJECTIVE IMPAIRMENTS: Abnormal gait, decreased balance, difficulty walking, dizziness, improper body mechanics, and postural dysfunction.   ACTIVITY LIMITATIONS: bending, stairs, bed mobility, and locomotion level  PARTICIPATION LIMITATIONS: meal prep, cleaning, laundry, driving, shopping, community  activity, and yard work  PERSONAL FACTORS: Age, Sex, and 3+ comorbidities: PMH significant for migraines, acquired hypothyroidism, breast CA (L breast, radiation, 2003), L breast surgery 2003, TKA L 2013, Graves disease, HTN, see chart for full details  are also affecting patient's functional outcome.   REHAB POTENTIAL: Good  CLINICAL DECISION MAKING: Evolving/moderate complexity  EVALUATION COMPLEXITY: Moderate   PLAN:  PT FREQUENCY: 1-2x/week  PT DURATION: 8 weeks  PLANNED INTERVENTIONS: 97164- PT Re-evaluation, 97110-Therapeutic exercises, 97530- Therapeutic activity, 97112- Neuromuscular re-education, 97535- Self Care, [id number]- Manual therapy, [phone]- Gait training, [phone]- Orthotic Fit/training, [phone]- Canalith repositioning, Patient/Family education, Balance training, Stair training, Joint mobilization, Spinal mobilization, Vestibular training, Cryotherapy, and Moist heat  PLAN FOR NEXT SESSION: reassess DH as needed, complete DGI and/or MCTSIB    Baird Kay, PT 08/29/2023, 9:12 AM

## 2023-08-29 ENCOUNTER — Ambulatory Visit: Payer: Medicare PPO | Attending: Internal Medicine

## 2023-08-29 DIAGNOSIS — R262 Difficulty in walking, not elsewhere classified: Secondary | ICD-10-CM | POA: Diagnosis present

## 2023-08-29 DIAGNOSIS — R2681 Unsteadiness on feet: Secondary | ICD-10-CM | POA: Diagnosis present

## 2023-08-29 DIAGNOSIS — R42 Dizziness and giddiness: Secondary | ICD-10-CM | POA: Insufficient documentation

## 2023-09-04 ENCOUNTER — Ambulatory Visit: Payer: Medicare PPO

## 2023-09-07 ENCOUNTER — Ambulatory Visit: Payer: Medicare PPO | Attending: Internal Medicine

## 2023-09-07 DIAGNOSIS — R2681 Unsteadiness on feet: Secondary | ICD-10-CM | POA: Insufficient documentation

## 2023-09-07 DIAGNOSIS — M542 Cervicalgia: Secondary | ICD-10-CM | POA: Insufficient documentation

## 2023-09-07 DIAGNOSIS — M6281 Muscle weakness (generalized): Secondary | ICD-10-CM | POA: Insufficient documentation

## 2023-09-07 DIAGNOSIS — R262 Difficulty in walking, not elsewhere classified: Secondary | ICD-10-CM | POA: Insufficient documentation

## 2023-09-07 DIAGNOSIS — R42 Dizziness and giddiness: Secondary | ICD-10-CM | POA: Insufficient documentation

## 2023-09-07 NOTE — Therapy (Signed)
 OUTPATIENT PHYSICAL THERAPY VESTIBULAR TREATMENT NOTE     Patient Name: Diane Shea MRN: [id number] DOB:1951/02/12, 73 y.o., female Today's Date: 09/07/2023  END OF SESSION:  PT End of Session - 09/07/23 1151     Visit Number 2    Number of Visits 17    Date for PT Re-Evaluation 10/24/23    PT Start Time 1016    PT Stop Time 1058    PT Time Calculation (min) 42 min    Equipment Utilized During Treatment Gait belt    Activity Tolerance Patient tolerated treatment well    Behavior During Therapy WFL for tasks assessed/performed              Past Medical History:  Diagnosis Date   Breast cancer (HCC) 2003   left breast cancer, radiation   Cancer (HCC) 2003   Breast   Encounter for screening colonoscopy 04/15/2013   Graves disease    Hypertension 2006   Migraines    Personal history of radiation therapy    Past Surgical History:  Procedure Laterality Date   ABDOMINAL HYSTERECTOMY  2004   BREAST EXCISIONAL BIOPSY Left 2003   sentinel node   BREAST LUMPECTOMY Left 2003   BREAST SURGERY Left 2003   mammosite placement   COLONOSCOPY  2004   CYST EXCISION  2009   vocal cord   FOOT SURGERY     HIP PINNING,CANNULATED Right 04/16/2015   Procedure: CANNULATED HIP PINNING;  Surgeon: Lynwood SHAUNNA Hue, MD;  Location: [facility] ORS;  Service: Orthopedics;  Laterality: Right;   REPLACEMENT TOTAL KNEE Left 2013   Patient Active Problem List   Diagnosis Date Noted   Dizziness 08/17/2023   Fall 08/17/2023   SAH (subarachnoid hemorrhage) (HCC) 08/17/2023   Pulmonary nodule 08/17/2023   Acquired hypothyroidism 08/17/2023   Headache 08/17/2023   Hip fracture, right (HCC) 04/15/2015   Hip fracture (HCC) 04/15/2015   Encounter for screening colonoscopy 04/15/2013    PCP: Lenon Layman ORN, MD  REFERRING PROVIDER: Lenon Layman ORN, MD   REFERRING DIAG: R42 (ICD-10-CM) - Dizziness and giddiness   THERAPY DIAG:  Dizziness and giddiness  ONSET DATE:  08/17/2023  Rationale for Evaluation and Treatment: Rehabilitation  SUBJECTIVE:   SUBJECTIVE STATEMENT: Pt felt better over the holidays following treatment, but as of today is now feeling worse. She sat up this morning from lying on her R side and says the room started moving. She reports she has not felt steady at all today. Ambulating with SPC.   Pt accompanied by: self  PERTINENT HISTORY: Pt is a pleasant 73 yo female referred to PT for dizziness. Pt admitted to [facility] 12/12-12/13/24 due to dizziness following mechanical fall on stairs with LOC. Pt found to have trace R frontal subarachnoid hemorrhage. Pt has also since been diagnosed with a concussion. While in hospital pt exhibited positive Dix-Hallpike and improved following Epley maneuvers with recommendation for outpatient PT follow-up. Pt reports maneuvers helped and that L & R sides were both treated. After this she reports she was d/c home. She reports no other vertigo episodes like what she was experiencing before, but reports difficulty turning her head occ and that this makes her woozy/unsteady, this is worse with fatigue. She has been sleeping in a recliner/not yet slept flat. Pt seeing family tomorrow for the holidays, lives with spouse, would like to improve her symptoms. No other falls since the 1 that brought her to the hospital. Pt did report neck soreness following hospital  stay, but OK now. PMH significant for migraines, acquired hypothyroidism, breast CA (L breast, radiation, 2003), L breast surgery 2003, TKA L 2013, Graves disease, HTN, see chart for full details  PAIN:  Are you having pain? No  PRECAUTIONS: Fall  RED FLAGS: None   WEIGHT BEARING RESTRICTIONS: No  FALLS: Has patient fallen in last 6 months? Yes. Number of falls 1  LIVING ENVIRONMENT: Lives with: lives with their spouse   PLOF: Independent  PATIENT GOALS: improve her symptoms  OBJECTIVE:  Note: Objective measures were completed at  Evaluation unless otherwise noted.  DIAGNOSTIC FINDINGS:    CT Cervical Spine 08/17/2023:   IMPRESSION: 1. Trace right frontal subarachnoid hemorrhage. No mass effect or midline shift. 2. No acute/traumatic cervical spine pathology. 3. An 11 mm left apical subpleural nodule. Dedicated chest CT is recommended for further evaluation.   These results were called by telephone at the time of interpretation on 08/17/2023 at 8:25 pm to DR [facility], who verbally acknowledged these results.     Electronically Signed   By: Vanetta Chou M.D.   On: 08/17/2023 20:27  CT HEAD 08/17/23:  IMPRESSION: 1. Trace right frontal subarachnoid hemorrhage. No mass effect or midline shift. 2. No acute/traumatic cervical spine pathology. 3. An 11 mm left apical subpleural nodule. Dedicated chest CT is recommended for further evaluation.   These results were called by telephone at the time of interpretation on 08/17/2023 at 8:25 pm to DR [facility], who verbally acknowledged these results.     Electronically Signed   By: Vanetta Chou M.D.   On: 08/17/2023 20:27  MR BRAIN 06/14/2023: IMPRESSION: No acute or reversible finding. No cause of headache identified. Minimal small vessel change of the cerebral hemispheric white matter, less than often seen at this age.     Electronically Signed   By: Oneil Officer M.D.   On: 06/14/2023 15:04  COGNITION: Overall cognitive status: Within functional limits for tasks assessed   POSTURE:  Very slight crouched posture, possibly due to unsteadiness  Cervical ROM:   Quick cervical AROM screen indicates pt has sufficient pain-free AROM (rotation, ext/flex) needed for vestibular positional testing  STRENGTH: not assessed today  BED MOBILITY:  Currently impaired due to sx (pt not sleeping in bed, she is currently sleeping in recliner)  TRANSFERS: Assistive device utilized: Single point cane  Sit to stand: SBA and CGA Stand to sit: Complete  Independence Chair to chair: SBA    GAIT: Gait pattern:  currently guarded due to feelings of unsteadiness, pt using SPC, scanning impaired per pt hx due to unsteadiness with head turns Distance walked: clinic distances Assistive device utilized: Single point cane Level of assistance: Modified independence and CGA   FUNCTIONAL TESTS:  DGI and MCTSIB to be performed future visit as needed  PATIENT SURVEYS:  FOTO 33 (goal 53)  VESTIBULAR ASSESSMENT:  GENERAL OBSERVATION: cautious gait with SPC due to unsteadiness    SYMPTOM BEHAVIOR:  Subjective history: vertigo s/p fall with concussion, improved after maneuvers provided in hospital to R and L sides (see above)  Non-Vestibular symptoms: headaches and nausea/vomiting  Type of dizziness:  previously vertigo, now more woozy/unsteady, particularly with head turns  Frequency: with particular movements  Relieving factors:  pt not currently lying flat/sleeping in recliner to avoid sx  Progression of symptoms: better since Epley maneuvers provided in hospital  OCULOMOTOR EXAM:  Ocular Alignment: normal  Ocular ROM: No Limitations  Spontaneous Nystagmus: absent  Gaze-Induced Nystagmus: right beating with right gaze  Smooth Pursuits:  slightly saccadic, possibly WNL for age  Saccades: hypometric/undershoots to L  Convergence/Divergence: WNL   VESTIBULAR - OCULAR REFLEX: deferred ocular reflex testing below due to time  Slow VOR:   VOR Cancellation:   Head-Impulse Test:   Dynamic Visual Acuity:    POSITIONAL TESTING:  Right Dix-Hallpike: POSITIVE, <30 sec duration, delayed onset, R torsional and upbeating nystagmus  Repeated following Epley (see below): still positive, but pt reports dizziness less intense, nystagmus is also less intense in appearance                                                                                                                             TREATMENT DATE:   NMR:  DHI: 72  CRM:  -Right  Dix-Hallpike: POSITIVE, brief nystagmus with difficulty determining direction. Pt also reports vertigo of short duration. -R Epley- pt exhibits non-fatiguing downbeat nystagmus in third position (nose turned down to table) -Right loaded Dix-Hallpike: positive with reports of vertigo -R Epley- continues to exhibit what appears to be non-fatiguing downbeat nystagmus in third position  -Deep head hang maneuver 1x: pt reports brief dizziness, possible downbeat nystagmus (few beats), pt reports more intense upon sitting, but shorter in duration  -R DH: exhibits clear, fast, R torsional downbeat nystagmus <15 seconds, with reports of vertigo   -R Epley to treat symptoms suggestive of affected R Anterior SCC, pt still with non-fatiguing downbeat nystagmus in 3rd position in Epley. Nystagmus does follow Alexander's law  L Dix-Hallpike: negative  Pt able to ambulate successfully without LOB following maneuver through clinic with SPC. She reports she feels ok to ambulate, reports her spouse is meeting her to take her home. PT reviews post-maneuver precautions, including taking an easier day today regarding activities/nothing too challenging to balance.   PATIENT EDUCATION: Education details: assessment findings, maneuvers, post-maneuver precautions, plan Person educated: Patient Education method: Explanation Education comprehension: verbalized understanding  HOME EXERCISE PROGRAM:   GOALS: Goals reviewed with patient? Yes  SHORT TERM GOALS: Target date: 10/05/2023   Patient will be independent in home exercise program to improve gait, mobility and balance for better functional independence with ADLs. Baseline: to be initiated as needed next 1-2 visits Goal status: INITIAL   LONG TERM GOALS: Target date: 11/02/2023    Patient will increase FOTO score to equal to or greater than 53  to demonstrate statistically significant improvement in mobility and quality of life.  Baseline: 33 Goal  status: INITIAL  2.  Patient will reduce dizziness handicap inventory score to <50, for less dizziness with ADLs and increased safety with home and work tasks.  Baseline: 1/2: 72 Goal status: INITIAL  3.  Patient will increase ABC scale score >80% to demonstrate better functional mobility and better confidence with ADLs.  Baseline:  Goal status: INITIAL  4.  Patient will increase dynamic gait index score to >19/24 as to demonstrate reduced fall risk and improved dynamic gait balance for better safety with  community/home ambulation.   Baseline:  Goal status: INITIAL   ASSESSMENT:  CLINICAL IMPRESSION: Pt reports return of vertigo-like symptoms this morning after sitting up from R side-lying position. Pt with negative L DH. Continues to be symptomatic with R DH. Nystagmus in nose down position appears non-fatiguing and downbeat. However, nystagmus does follow Alexander's law. This is suggestive of possible cupolothiasis, but central contributor could also be a factor. Plan to reassess next visit.The pt will benefit from further skilled PT to improve positionally-triggered dizziness and balance in order to increase QOL and decrease fall risk.  OBJECTIVE IMPAIRMENTS: Abnormal gait, decreased balance, difficulty walking, dizziness, improper body mechanics, and postural dysfunction.   ACTIVITY LIMITATIONS: bending, stairs, bed mobility, and locomotion level  PARTICIPATION LIMITATIONS: meal prep, cleaning, laundry, driving, shopping, community activity, and yard work  PERSONAL FACTORS: Age, Sex, and 3+ comorbidities: PMH significant for migraines, acquired hypothyroidism, breast CA (L breast, radiation, 2003), L breast surgery 2003, TKA L 2013, Graves disease, HTN, see chart for full details  are also affecting patient's functional outcome.   REHAB POTENTIAL: Good  CLINICAL DECISION MAKING: Evolving/moderate complexity  EVALUATION COMPLEXITY: Moderate   PLAN:  PT FREQUENCY:  1-2x/week  PT DURATION: 8 weeks  PLANNED INTERVENTIONS: 97164- PT Re-evaluation, 97110-Therapeutic exercises, 97530- Therapeutic activity, 97112- Neuromuscular re-education, 97535- Self Care, 02859- Manual therapy, [phone]- Gait training, [phone]- Orthotic Fit/training, [phone]- Canalith repositioning, Patient/Family education, Balance training, Stair training, Joint mobilization, Spinal mobilization, Vestibular training, Cryotherapy, and Moist heat    PLAN FOR NEXT SESSION: reassess DH as needed, complete DGI and/or MCTSIB    Darryle JONELLE Patten, PT 09/07/2023, 12:11 PM

## 2023-09-11 ENCOUNTER — Ambulatory Visit: Payer: Medicare PPO

## 2023-09-11 DIAGNOSIS — R42 Dizziness and giddiness: Secondary | ICD-10-CM | POA: Diagnosis present

## 2023-09-11 DIAGNOSIS — R262 Difficulty in walking, not elsewhere classified: Secondary | ICD-10-CM | POA: Diagnosis present

## 2023-09-11 DIAGNOSIS — M542 Cervicalgia: Secondary | ICD-10-CM | POA: Diagnosis present

## 2023-09-11 DIAGNOSIS — M6281 Muscle weakness (generalized): Secondary | ICD-10-CM | POA: Diagnosis present

## 2023-09-11 DIAGNOSIS — R2681 Unsteadiness on feet: Secondary | ICD-10-CM

## 2023-09-11 NOTE — Therapy (Signed)
 OUTPATIENT PHYSICAL THERAPY VESTIBULAR TREATMENT NOTE     Patient Name: Diane Shea MRN: [id number] DOB:[birth year], 73 y.o., female Today's Date: 09/11/2023  END OF SESSION:  PT End of Session - 09/11/23 0803     Visit Number 3    Number of Visits 17    PT Start Time 0804    PT Stop Time 0837    PT Time Calculation (min) 33 min    Equipment Utilized During Treatment Gait belt    Activity Tolerance Patient tolerated treatment well    Behavior During Therapy [facility] for tasks assessed/performed              Past Medical History:  Diagnosis Date   Breast cancer (HCC) 2003   left breast cancer, radiation   Cancer (HCC) 2003   Breast   Encounter for screening colonoscopy 04/15/2013   Graves disease    Hypertension 2006   Migraines    Personal history of radiation therapy    Past Surgical History:  Procedure Laterality Date   ABDOMINAL HYSTERECTOMY  2004   BREAST EXCISIONAL BIOPSY Left 2003   sentinel node   BREAST LUMPECTOMY Left 2003   BREAST SURGERY Left 2003   mammosite placement   COLONOSCOPY  2004   CYST EXCISION  2009   vocal cord   FOOT SURGERY     HIP PINNING,CANNULATED Right 04/16/2015   Procedure: CANNULATED HIP PINNING;  Surgeon: Lynwood SHAUNNA Hue, MD;  Location: [facility] ORS;  Service: Orthopedics;  Laterality: Right;   REPLACEMENT TOTAL KNEE Left 2013   Patient Active Problem List   Diagnosis Date Noted   Dizziness 08/17/2023   Fall 08/17/2023   SAH (subarachnoid hemorrhage) (HCC) 08/17/2023   Pulmonary nodule 08/17/2023   Acquired hypothyroidism 08/17/2023   Headache 08/17/2023   Hip fracture, right (HCC) 04/15/2015   Hip fracture (HCC) 04/15/2015   Encounter for screening colonoscopy 04/15/2013    PCP: Lenon Layman ORN, MD  REFERRING PROVIDER: Lenon Layman ORN, MD   REFERRING DIAG: R42 (ICD-10-CM) - Dizziness and giddiness   THERAPY DIAG:  Unsteadiness on feet  ONSET DATE: 08/17/2023  Rationale for Evaluation and Treatment:  Rehabilitation  SUBJECTIVE:   SUBJECTIVE STATEMENT: Pt reports she is feeling better, states the dizziness is pretty much gone I'm happy to say. She reports she still feels a little off regarding balance. She has a busy day today. She says symptoms have been getting better each day.   Pt accompanied by: self  PERTINENT HISTORY: Pt is a pleasant 73 yo female referred to PT for dizziness. Pt admitted to [facility] 12/12-12/13/24 due to dizziness following mechanical fall on stairs with LOC. Pt found to have trace R frontal subarachnoid hemorrhage. Pt has also since been diagnosed with a concussion. While in hospital pt exhibited positive Dix-Hallpike and improved following Epley maneuvers with recommendation for outpatient PT follow-up. Pt reports maneuvers helped and that L & R sides were both treated. After this she reports she was d/c home. She reports no other vertigo episodes like what she was experiencing before, but reports difficulty turning her head occ and that this makes her woozy/unsteady, this is worse with fatigue. She has been sleeping in a recliner/not yet slept flat. Pt seeing family tomorrow for the holidays, lives with spouse, would like to improve her symptoms. No other falls since the 1 that brought her to the hospital. Pt did report neck soreness following hospital stay, but OK now. PMH significant for migraines, acquired hypothyroidism, breast CA (  L breast, radiation, 2003), L breast surgery 2003, TKA L 2013, Graves disease, HTN, see chart for full details  PAIN:  Are you having pain? No  PRECAUTIONS: Fall  RED FLAGS: None   WEIGHT BEARING RESTRICTIONS: No  FALLS: Has patient fallen in last 6 months? Yes. Number of falls 1  LIVING ENVIRONMENT: Lives with: lives with their spouse   PLOF: Independent  PATIENT GOALS: improve her symptoms  OBJECTIVE:  Note: Objective measures were completed at Evaluation unless otherwise noted.  DIAGNOSTIC FINDINGS:    CT  Cervical Spine 08/17/2023:   IMPRESSION: 1. Trace right frontal subarachnoid hemorrhage. No mass effect or midline shift. 2. No acute/traumatic cervical spine pathology. 3. An 11 mm left apical subpleural nodule. Dedicated chest CT is recommended for further evaluation.   These results were called by telephone at the time of interpretation on 08/17/2023 at 8:25 pm to DR [facility], who verbally acknowledged these results.     Electronically Signed   By: Vanetta Chou M.D.   On: 08/17/2023 20:27  CT HEAD 08/17/23:  IMPRESSION: 1. Trace right frontal subarachnoid hemorrhage. No mass effect or midline shift. 2. No acute/traumatic cervical spine pathology. 3. An 11 mm left apical subpleural nodule. Dedicated chest CT is recommended for further evaluation.   These results were called by telephone at the time of interpretation on 08/17/2023 at 8:25 pm to DR [facility], who verbally acknowledged these results.     Electronically Signed   By: Vanetta Chou M.D.   On: 08/17/2023 20:27  MR BRAIN 06/14/2023: IMPRESSION: No acute or reversible finding. No cause of headache identified. Minimal small vessel change of the cerebral hemispheric white matter, less than often seen at this age.     Electronically Signed   By: Oneil Officer M.D.   On: 06/14/2023 15:04  COGNITION: Overall cognitive status: Within functional limits for tasks assessed   POSTURE:  Very slight crouched posture, possibly due to unsteadiness  Cervical ROM:   Quick cervical AROM screen indicates pt has sufficient pain-free AROM (rotation, ext/flex) needed for vestibular positional testing  STRENGTH: not assessed today  BED MOBILITY:  Currently impaired due to sx (pt not sleeping in bed, she is currently sleeping in recliner)  TRANSFERS: Assistive device utilized: Single point cane  Sit to stand: SBA and CGA Stand to sit: Complete Independence Chair to chair: SBA    GAIT: Gait pattern:   currently guarded due to feelings of unsteadiness, pt using SPC, scanning impaired per pt hx due to unsteadiness with head turns Distance walked: clinic distances Assistive device utilized: Single point cane Level of assistance: Modified independence and CGA   FUNCTIONAL TESTS:  DGI and MCTSIB to be performed future visit as needed  PATIENT SURVEYS:  FOTO 33 (goal 53)  VESTIBULAR ASSESSMENT:  GENERAL OBSERVATION: cautious gait with SPC due to unsteadiness    SYMPTOM BEHAVIOR:  Subjective history: vertigo s/p fall with concussion, improved after maneuvers provided in hospital to R and L sides (see above)  Non-Vestibular symptoms: headaches and nausea/vomiting  Type of dizziness:  previously vertigo, now more woozy/unsteady, particularly with head turns  Frequency: with particular movements  Relieving factors:  pt not currently lying flat/sleeping in recliner to avoid sx  Progression of symptoms: better since Epley maneuvers provided in hospital  OCULOMOTOR EXAM:  Ocular Alignment: normal  Ocular ROM: No Limitations  Spontaneous Nystagmus: absent  Gaze-Induced Nystagmus: right beating with right gaze  Smooth Pursuits:  slightly saccadic, possibly WNL for age  Saccades:  hypometric/undershoots to L  Convergence/Divergence: WNL   VESTIBULAR - OCULAR REFLEX: deferred ocular reflex testing below due to time  Slow VOR:   VOR Cancellation:   Head-Impulse Test:   Dynamic Visual Acuity:    POSITIONAL TESTING:  Right Dix-Hallpike: POSITIVE, <30 sec duration, delayed onset, R torsional and upbeating nystagmus  Repeated following Epley (see below): still positive, but pt reports dizziness less intense, nystagmus is also less intense in appearance                                                                                                                             TREATMENT DATE:   NMR:  DGI: 22/24 MCSTIB: able to complete conditions 1 and 3 without significant sway,  increased sway observed condition, unable to complete condition 4   Several minutes of the following: Gait with vertical head turns over 10 meters Gait with horizontal head turns over 10 meters Tandem gait in // bars    TA: Reviewed 2 sets of each intervention in session to confirm correct technique and safety with HEP: Access Code: [email or web address] URL: [email or web address] Date: 09/11/2023 Prepared by: Darryle Patten  Exercises - Standing Single Leg Stance with Counter Support  - 2 x daily - 5-7 x weekly - 2 sets - 1 reps - 30 seconds/leg hold  - Standing Balance in Corner with Eyes Closed  - 2 x daily - 5-7 x weekly - 2 sets - 1 reps - 30 seconds hold - further instruction to use sturdy chair in front for additoinal support  PATIENT EDUCATION: Education details: assessment findings, maneuvers, post-maneuver precautions, plan Person educated: Patient Education method: Explanation Education comprehension: verbalized understanding  HOME EXERCISE PROGRAM:  1/6:   Access Code: [email or web address] URL: [email or web address] Date: 09/11/2023 Prepared by: Darryle Patten  Exercises - Standing Single Leg Stance with Counter Support  - 2 x daily - 5-7 x weekly - 2 sets - 1 reps - 30 seconds/leg hold  - Standing Balance in Corner with Eyes Closed  - 2 x daily - 5-7 x weekly - 2 sets - 1 reps - 30 seconds hold - further instruction to use sturdy chair in front for additoinal support   GOALS: Goals reviewed with patient? Yes  SHORT TERM GOALS: Target date: 10/09/2023   Patient will be independent in home exercise program to improve gait, mobility and balance for better functional independence with ADLs. Baseline: to be initiated as needed next 1-2 visits Goal status: INITIAL   LONG TERM GOALS: Target date: 11/06/2023    Patient will increase FOTO score to equal to or greater than 53  to demonstrate statistically significant improvement in mobility and quality of life.   Baseline: 33 Goal status: INITIAL  2.  Patient will reduce dizziness handicap inventory score to <50, for less dizziness with ADLs and increased safety with home and work tasks.  Baseline: 1/2: 72 Goal status: INITIAL  3.  Patient will increase ABC scale score >80% to  demonstrate better functional mobility and better confidence with ADLs.  Baseline:  Goal status: INITIAL  4.  Patient will increase dynamic gait index score to >19/24 as to demonstrate reduced fall risk and improved dynamic gait balance for better safety with community/home ambulation.   Baseline:  Goal status: INITIAL   ASSESSMENT:  CLINICAL IMPRESSION: Pt returns with reports of further improvement in symptoms, no dizziness, but just feeling somewhat off with balance. PT provided and reviewed HEP in session to address balance impairments. Plan to repeat Dix-Hallpike next visit if pt does not continue to experience improvement. The pt will benefit from further skilled PT to improve positionally-triggered dizziness and balance in order to increase QOL and decrease fall risk.  OBJECTIVE IMPAIRMENTS: Abnormal gait, decreased balance, difficulty walking, dizziness, improper body mechanics, and postural dysfunction.   ACTIVITY LIMITATIONS: bending, stairs, bed mobility, and locomotion level  PARTICIPATION LIMITATIONS: meal prep, cleaning, laundry, driving, shopping, community activity, and yard work  PERSONAL FACTORS: Age, Sex, and 3+ comorbidities: PMH significant for migraines, acquired hypothyroidism, breast CA (L breast, radiation, 2003), L breast surgery 2003, TKA L 2013, Graves disease, HTN, see chart for full details  are also affecting patient's functional outcome.   REHAB POTENTIAL: Good  CLINICAL DECISION MAKING: Evolving/moderate complexity  EVALUATION COMPLEXITY: Moderate   PLAN:  PT FREQUENCY: 1-2x/week  PT DURATION: 8 weeks  PLANNED INTERVENTIONS: 97164- PT Re-evaluation, 97110-Therapeutic  exercises, 97530- Therapeutic activity, 97112- Neuromuscular re-education, 97535- Self Care, 02859- Manual therapy, [phone]- Gait training, [phone]- Orthotic Fit/training, [phone]- Canalith repositioning, Patient/Family education, Balance training, Stair training, Joint mobilization, Spinal mobilization, Vestibular training, Cryotherapy, and Moist heat    PLAN FOR NEXT SESSION: reassess DH as needed, balance   Darryle JONELLE Patten, PT 09/11/2023, 8:42 AM

## 2023-09-14 ENCOUNTER — Ambulatory Visit: Payer: Medicare PPO

## 2023-09-14 DIAGNOSIS — R2681 Unsteadiness on feet: Secondary | ICD-10-CM

## 2023-09-14 DIAGNOSIS — R42 Dizziness and giddiness: Secondary | ICD-10-CM | POA: Diagnosis not present

## 2023-09-14 NOTE — Therapy (Signed)
 OUTPATIENT PHYSICAL THERAPY VESTIBULAR TREATMENT NOTE     Patient Name: Diane Shea MRN: [id number] DOB:[birth year], 73 y.o., female Today's Date: 09/14/2023  END OF SESSION:  PT End of Session - 09/14/23 1101     Visit Number 4    Number of Visits 17    Date for PT Re-Evaluation 10/24/23    PT Start Time 1101    PT Stop Time 1128    PT Time Calculation (min) 27 min    Equipment Utilized During Treatment Gait belt    Activity Tolerance Patient tolerated treatment well    Behavior During Therapy WFL for tasks assessed/performed              Past Medical History:  Diagnosis Date   Breast cancer (HCC) 2003   left breast cancer, radiation   Cancer (HCC) 2003   Breast   Encounter for screening colonoscopy 04/15/2013   Graves disease    Hypertension 2006   Migraines    Personal history of radiation therapy    Past Surgical History:  Procedure Laterality Date   ABDOMINAL HYSTERECTOMY  2004   BREAST EXCISIONAL BIOPSY Left 2003   sentinel node   BREAST LUMPECTOMY Left 2003   BREAST SURGERY Left 2003   mammosite placement   COLONOSCOPY  2004   CYST EXCISION  2009   vocal cord   FOOT SURGERY     HIP PINNING,CANNULATED Right 04/16/2015   Procedure: CANNULATED HIP PINNING;  Surgeon: Lynwood SHAUNNA Hue, MD;  Location: [facility] ORS;  Service: Orthopedics;  Laterality: Right;   REPLACEMENT TOTAL KNEE Left 2013   Patient Active Problem List   Diagnosis Date Noted   Dizziness 08/17/2023   Fall 08/17/2023   SAH (subarachnoid hemorrhage) (HCC) 08/17/2023   Pulmonary nodule 08/17/2023   Acquired hypothyroidism 08/17/2023   Headache 08/17/2023   Hip fracture, right (HCC) 04/15/2015   Hip fracture (HCC) 04/15/2015   Encounter for screening colonoscopy 04/15/2013    PCP: Lenon Layman ORN, MD  REFERRING PROVIDER: Lenon Layman ORN, MD   REFERRING DIAG: R42 (ICD-10-CM) - Dizziness and giddiness   THERAPY DIAG:  Unsteadiness on feet  ONSET DATE:  08/17/2023  Rationale for Evaluation and Treatment: Rehabilitation  SUBJECTIVE:   SUBJECTIVE STATEMENT: Pt  says she's done really well. Has not really had any episodes of dizziness except one sensation that felt like a wave since last visit.  Pt reports migraine this morning, took medication for it, says she felt migraine on her R side.  Balance has felt OK. Not needing to use SPC as much.   Pt accompanied by: self  PERTINENT HISTORY: Pt is a pleasant 72 yo female referred to PT for dizziness. Pt admitted to [facility] 12/12-12/13/24 due to dizziness following mechanical fall on stairs with LOC. Pt found to have trace R frontal subarachnoid hemorrhage. Pt has also since been diagnosed with a concussion. While in hospital pt exhibited positive Dix-Hallpike and improved following Epley maneuvers with recommendation for outpatient PT follow-up. Pt reports maneuvers helped and that L & R sides were both treated. After this she reports she was d/c home. She reports no other vertigo episodes like what she was experiencing before, but reports difficulty turning her head occ and that this makes her woozy/unsteady, this is worse with fatigue. She has been sleeping in a recliner/not yet slept flat. Pt seeing family tomorrow for the holidays, lives with spouse, would like to improve her symptoms. No other falls since the 1 that brought her  to the hospital. Pt did report neck soreness following hospital stay, but OK now. PMH significant for migraines, acquired hypothyroidism, breast CA (L breast, radiation, 2003), L breast surgery 2003, TKA L 2013, Graves disease, HTN, see chart for full details  PAIN:  Are you having pain? No  PRECAUTIONS: Fall  RED FLAGS: None   WEIGHT BEARING RESTRICTIONS: No  FALLS: Has patient fallen in last 6 months? Yes. Number of falls 1  LIVING ENVIRONMENT: Lives with: lives with their spouse   PLOF: Independent  PATIENT GOALS: improve her symptoms  OBJECTIVE:   Note: Objective measures were completed at Evaluation unless otherwise noted.  DIAGNOSTIC FINDINGS:    CT Cervical Spine 08/17/2023:   IMPRESSION: 1. Trace right frontal subarachnoid hemorrhage. No mass effect or midline shift. 2. No acute/traumatic cervical spine pathology. 3. An 11 mm left apical subpleural nodule. Dedicated chest CT is recommended for further evaluation.   These results were called by telephone at the time of interpretation on 08/17/2023 at 8:25 pm to DR [facility], who verbally acknowledged these results.     Electronically Signed   By: Vanetta Chou M.D.   On: 08/17/2023 20:27  CT HEAD 08/17/23:  IMPRESSION: 1. Trace right frontal subarachnoid hemorrhage. No mass effect or midline shift. 2. No acute/traumatic cervical spine pathology. 3. An 11 mm left apical subpleural nodule. Dedicated chest CT is recommended for further evaluation.   These results were called by telephone at the time of interpretation on 08/17/2023 at 8:25 pm to DR [facility], who verbally acknowledged these results.     Electronically Signed   By: Vanetta Chou M.D.   On: 08/17/2023 20:27  MR BRAIN 06/14/2023: IMPRESSION: No acute or reversible finding. No cause of headache identified. Minimal small vessel change of the cerebral hemispheric white matter, less than often seen at this age.     Electronically Signed   By: Oneil Officer M.D.   On: 06/14/2023 15:04  COGNITION: Overall cognitive status: Within functional limits for tasks assessed   POSTURE:  Very slight crouched posture, possibly due to unsteadiness  Cervical ROM:   Quick cervical AROM screen indicates pt has sufficient pain-free AROM (rotation, ext/flex) needed for vestibular positional testing  STRENGTH: not assessed today  BED MOBILITY:  Currently impaired due to sx (pt not sleeping in bed, she is currently sleeping in recliner)  TRANSFERS: Assistive device utilized: Single point cane  Sit to  stand: SBA and CGA Stand to sit: Complete Independence Chair to chair: SBA    GAIT: Gait pattern:  currently guarded due to feelings of unsteadiness, pt using SPC, scanning impaired per pt hx due to unsteadiness with head turns Distance walked: clinic distances Assistive device utilized: Single point cane Level of assistance: Modified independence and CGA   FUNCTIONAL TESTS:  DGI and MCTSIB to be performed future visit as needed  PATIENT SURVEYS:  FOTO 33 (goal 53)  VESTIBULAR ASSESSMENT:  GENERAL OBSERVATION: cautious gait with SPC due to unsteadiness    SYMPTOM BEHAVIOR:  Subjective history: vertigo s/p fall with concussion, improved after maneuvers provided in hospital to R and L sides (see above)  Non-Vestibular symptoms: headaches and nausea/vomiting  Type of dizziness:  previously vertigo, now more woozy/unsteady, particularly with head turns  Frequency: with particular movements  Relieving factors:  pt not currently lying flat/sleeping in recliner to avoid sx  Progression of symptoms: better since Epley maneuvers provided in hospital  OCULOMOTOR EXAM:  Ocular Alignment: normal  Ocular ROM: No Limitations  Spontaneous  Nystagmus: absent  Gaze-Induced Nystagmus: right beating with right gaze  Smooth Pursuits:  slightly saccadic, possibly WNL for age  Saccades: hypometric/undershoots to L  Convergence/Divergence: WNL   VESTIBULAR - OCULAR REFLEX: deferred ocular reflex testing below due to time  Slow VOR:   VOR Cancellation:   Head-Impulse Test:   Dynamic Visual Acuity:    POSITIONAL TESTING:  Right Dix-Hallpike: POSITIVE, <30 sec duration, delayed onset, R torsional and upbeating nystagmus  Repeated following Epley (see below): still positive, but pt reports dizziness less intense, nystagmus is also less intense in appearance                                                                                                                             TREATMENT  DATE:     CRM: x 9 min  Modified/loaded R Dix-Hallpike: negative     TA:  Gait following maneuver with SBA, no AD, 2x10 meters with turning 2x - no unsteadiness or dizziness  FOTO: 51   HEP REVIEW: - Standing Single Leg Stance with Counter Support  - 2 sets - 1 rep - 30 seconds/leg hold  - Standing Balance in Corner with Eyes Closed  - 2 sets - 1 reps - 30 seconds hold   Review of signs and symptoms that may indicate need for future follow-up for dizziness. Recommendation for follow-up in 2-3 weeks. Pt agreeable to this plan.      PATIENT EDUCATION: Education details: assessment findings, plan Person educated: Patient Education method: Explanation Education comprehension: verbalized understanding  HOME EXERCISE PROGRAM:  1/6:   Access Code: [email or web address] URL: [email or web address] Date: 09/11/2023 Prepared by: Darryle Patten  Exercises - Standing Single Leg Stance with Counter Support  - 2 x daily - 5-7 x weekly - 2 sets - 1 reps - 30 seconds/leg hold  - Standing Balance in Corner with Eyes Closed  - 2 x daily - 5-7 x weekly - 2 sets - 1 reps - 30 seconds hold - further instruction to use sturdy chair in front for additoinal support   GOALS: Goals reviewed with patient? Yes  SHORT TERM GOALS: Target date: 10/12/2023   Patient will be independent in home exercise program to improve gait, mobility and balance for better functional independence with ADLs. Baseline: to be initiated as needed next 1-2 visits Goal status: INITIAL   LONG TERM GOALS: Target date: 11/09/2023    Patient will increase FOTO score to equal to or greater than 53  to demonstrate statistically significant improvement in mobility and quality of life.  Baseline: 33; 1/9: 51 Goal status: PARTIALLY MET  2.  Patient will reduce dizziness handicap inventory score to <50, for less dizziness with ADLs and increased safety with home and work tasks.  Baseline: 1/2: 72 Goal status:  INITIAL  3.  Patient will increase ABC scale score >80% to demonstrate better functional mobility and better confidence with ADLs.  Baseline:  Goal status: INITIAL  4.  Patient  will increase dynamic gait index score to >19/24 as to demonstrate reduced fall risk and improved dynamic gait balance for better safety with community/home ambulation.   Baseline: scored 22/24 on 09/11/2023 Goal status: MET on 09/11/2023   ASSESSMENT:  CLINICAL IMPRESSION: Pt continues to demonstrate improvement with reports of no vertigo/dizziness.She has now partially met FOTO goal. Dix-Hallpike retested and found to be negative. PT reviewed HEP and plan for follow-up in 2-3 weeks. Pt agreeable to this plan, The pt will benefit from further skilled PT to improve positionally-triggered dizziness and balance in order to increase QOL and decrease fall risk.  OBJECTIVE IMPAIRMENTS: Abnormal gait, decreased balance, difficulty walking, dizziness, improper body mechanics, and postural dysfunction.   ACTIVITY LIMITATIONS: bending, stairs, bed mobility, and locomotion level  PARTICIPATION LIMITATIONS: meal prep, cleaning, laundry, driving, shopping, community activity, and yard work  PERSONAL FACTORS: Age, Sex, and 3+ comorbidities: PMH significant for migraines, acquired hypothyroidism, breast CA (L breast, radiation, 2003), L breast surgery 2003, TKA L 2013, Graves disease, HTN, see chart for full details  are also affecting patient's functional outcome.   REHAB POTENTIAL: Good  CLINICAL DECISION MAKING: Evolving/moderate complexity  EVALUATION COMPLEXITY: Moderate   PLAN:  PT FREQUENCY: 1-2x/week  PT DURATION: 8 weeks  PLANNED INTERVENTIONS: 97164- PT Re-evaluation, 97110-Therapeutic exercises, 97530- Therapeutic activity, 97112- Neuromuscular re-education, 97535- Self Care, [id number]- Manual therapy, [phone]- Gait training, [phone]- Orthotic Fit/training, [phone]- Canalith repositioning, Patient/Family education,  Balance training, Stair training, Joint mobilization, Spinal mobilization, Vestibular training, Cryotherapy, and Moist heat    PLAN FOR NEXT SESSION: reassess DH as needed, balance   Darryle JONELLE Patten, PT 09/14/2023, 11:37 AM

## 2023-09-19 ENCOUNTER — Ambulatory Visit: Payer: Medicare PPO

## 2023-09-21 ENCOUNTER — Ambulatory Visit: Payer: Medicare PPO

## 2023-09-25 ENCOUNTER — Ambulatory Visit: Payer: Medicare PPO

## 2023-09-27 ENCOUNTER — Ambulatory Visit: Payer: Medicare PPO

## 2023-10-02 ENCOUNTER — Ambulatory Visit: Payer: Medicare PPO

## 2023-10-02 DIAGNOSIS — R42 Dizziness and giddiness: Secondary | ICD-10-CM | POA: Diagnosis not present

## 2023-10-02 DIAGNOSIS — M542 Cervicalgia: Secondary | ICD-10-CM

## 2023-10-02 DIAGNOSIS — R262 Difficulty in walking, not elsewhere classified: Secondary | ICD-10-CM

## 2023-10-02 DIAGNOSIS — M6281 Muscle weakness (generalized): Secondary | ICD-10-CM

## 2023-10-02 NOTE — Therapy (Signed)
OUTPATIENT PHYSICAL THERAPY VESTIBULAR TREATMENT NOTE/RECERT     Patient Name: Diane Shea MRN: [id number] DOB:02-12-1951, 73 y.o., female Today's Date: 10/02/2023  END OF SESSION:  PT End of Session - 10/02/23 1150     Visit Number 5    Number of Visits 29    Date for PT Re-Evaluation 12/25/23    PT Start Time 0809    PT Stop Time 0845    PT Time Calculation (min) 36 min    Equipment Utilized During Treatment Gait belt    Activity Tolerance Patient tolerated treatment well    Behavior During Therapy [facility] for tasks assessed/performed               Past Medical History:  Diagnosis Date   Breast cancer (HCC) 2003   left breast cancer, radiation   Cancer (HCC) 2003   Breast   Encounter for screening colonoscopy 04/15/2013   Graves disease    Hypertension 2006   Migraines    Personal history of radiation therapy    Past Surgical History:  Procedure Laterality Date   ABDOMINAL HYSTERECTOMY  2004   BREAST EXCISIONAL BIOPSY Left 2003   sentinel node   BREAST LUMPECTOMY Left 2003   BREAST SURGERY Left 2003   mammosite placement   COLONOSCOPY  2004   CYST EXCISION  2009   vocal cord   FOOT SURGERY     HIP PINNING,CANNULATED Right 04/16/2015   Procedure: CANNULATED HIP PINNING;  Surgeon: Donato Heinz, MD;  Location: [facility] ORS;  Service: Orthopedics;  Laterality: Right;   REPLACEMENT TOTAL KNEE Left 2013   Patient Active Problem List   Diagnosis Date Noted   Dizziness 08/17/2023   Fall 08/17/2023   SAH (subarachnoid hemorrhage) (HCC) 08/17/2023   Pulmonary nodule 08/17/2023   Acquired hypothyroidism 08/17/2023   Headache 08/17/2023   Hip fracture, right (HCC) 04/15/2015   Hip fracture (HCC) 04/15/2015   Encounter for screening colonoscopy 04/15/2013    PCP: Lauro Regulus, MD  REFERRING PROVIDER: Lauro Regulus, MD   REFERRING DIAG: R42 (ICD-10-CM) - Dizziness and giddiness   THERAPY DIAG:  Cervicalgia  Difficulty in walking, not  elsewhere classified  Muscle weakness (generalized)  ONSET DATE: 08/17/2023  Rationale for Evaluation and Treatment: Rehabilitation  SUBJECTIVE:   SUBJECTIVE STATEMENT:  Pt says she has been great in regard to her dizzy symptoms. She reports she has noticed she gets very tired, however. She reports no dizziness, and has returned to driving. Not using a cane anymore. She is now most concerned with her migraine frequency and related symptoms. Her headaches have increased in frequency over past year. Reports medications have not made a lot of difference. She feels tight in shoulder/neck region, can feel a painful soreness with head movement. Feels more often on R side and often has headaches on R side. Does feel headaches have decreased some after resolution of dizziness. Was experiencing 10+ headaches in a month, did not last all day though with medication. She had a migraine head 2 days last week. Now can have migraines 4 days in a row, but they go away during day and come back the next day. Would like to focus on cervical pain/stiffness and decreasing frequency of headaches.   Pt accompanied by: self  PERTINENT HISTORY: Pt is a pleasant 73 yo female referred to PT for dizziness. Pt admitted to [facility] 12/12-12/13/24 due to dizziness following mechanical fall on stairs with LOC. Pt found to have trace R frontal  subarachnoid hemorrhage. Pt has also since been diagnosed with a concussion. While in hospital pt exhibited positive Dix-Hallpike and improved following Epley maneuvers with recommendation for outpatient PT follow-up. Pt reports maneuvers helped and that L & R sides were both treated. After this she reports she was d/c home. She reports no other vertigo episodes like what she was experiencing before, but reports difficulty turning her head occ and that this makes her woozy/unsteady, this is worse with fatigue. She has been sleeping in a recliner/not yet slept flat. Pt seeing family tomorrow  for the holidays, lives with spouse, would like to improve her symptoms. No other falls since the 1 that brought her to the hospital. Pt did report neck soreness following hospital stay, but OK now. PMH significant for migraines, acquired hypothyroidism, breast CA (L breast, radiation, 2003), L breast surgery 2003, TKA L 2013, Graves disease, HTN, see chart for full details  Now being treated to reduce migraine symptoms.  PAIN:  Are you having pain? No  PRECAUTIONS: Fall  RED FLAGS: None   WEIGHT BEARING RESTRICTIONS: No  FALLS: Has patient fallen in last 6 months? Yes. Number of falls 1  LIVING ENVIRONMENT: Lives with: lives with their spouse   PLOF: Independent  PATIENT GOALS: improve her symptoms  OBJECTIVE:  Note: Objective measures were completed at Evaluation unless otherwise noted.  DIAGNOSTIC FINDINGS:    CT Cervical Spine 08/17/2023: "  IMPRESSION: 1. Trace right frontal subarachnoid hemorrhage. No mass effect or midline shift. 2. No acute/traumatic cervical spine pathology. 3. An 11 mm left apical subpleural nodule. Dedicated chest CT is recommended for further evaluation.   These results were called by telephone at the time of interpretation on 08/17/2023 at 8:25 pm to DR [facility], who verbally acknowledged these results."     Electronically Signed   By: Elgie Collard M.D.   On: 08/17/2023 20:27"  CT HEAD 08/17/23:  "IMPRESSION: 1. Trace right frontal subarachnoid hemorrhage. No mass effect or midline shift. 2. No acute/traumatic cervical spine pathology. 3. An 11 mm left apical subpleural nodule. Dedicated chest CT is recommended for further evaluation.   These results were called by telephone at the time of interpretation on 08/17/2023 at 8:25 pm to DR [facility], who verbally acknowledged these results.     Electronically Signed   By: Elgie Collard M.D.   On: 08/17/2023 20:27"  MR BRAIN 06/14/2023: "IMPRESSION: No acute or reversible finding.  No cause of headache identified. Minimal small vessel change of the cerebral hemispheric white matter, less than often seen at this age.     Electronically Signed   By: Paulina Fusi M.D."   On: 06/14/2023 15:04  COGNITION: Overall cognitive status: Within functional limits for tasks assessed   POSTURE:  Very slight crouched posture, possibly due to unsteadiness  Cervical ROM:   Quick cervical AROM screen indicates pt has sufficient pain-free AROM (rotation, ext/flex) needed for vestibular positional testing  STRENGTH: not assessed today  BED MOBILITY:  Currently impaired due to sx (pt not sleeping in bed, she is currently sleeping in recliner)  TRANSFERS: Assistive device utilized: Single point cane  Sit to stand: SBA and CGA Stand to sit: Complete Independence Chair to chair: SBA    GAIT: Gait pattern:  currently guarded due to feelings of unsteadiness, pt using SPC, scanning impaired per pt hx due to unsteadiness with head turns Distance walked: clinic distances Assistive device utilized: Single point cane Level of assistance: Modified independence and CGA   FUNCTIONAL  TESTS:  DGI and MCTSIB to be performed future visit as needed  PATIENT SURVEYS:  FOTO 33 (goal 53)  VESTIBULAR ASSESSMENT:  GENERAL OBSERVATION: cautious gait with SPC due to unsteadiness    SYMPTOM BEHAVIOR:  Subjective history: vertigo s/p fall with concussion, improved after maneuvers provided in hospital to R and L sides (see above)  Non-Vestibular symptoms: headaches and nausea/vomiting  Type of dizziness:  previously vertigo, now more woozy/unsteady, particularly with head turns  Frequency: with particular movements  Relieving factors:  pt not currently lying flat/sleeping in recliner to avoid sx  Progression of symptoms: better since Epley maneuvers provided in hospital  OCULOMOTOR EXAM:  Ocular Alignment: normal  Ocular ROM: No Limitations  Spontaneous Nystagmus:  absent  Gaze-Induced Nystagmus: right beating with right gaze  Smooth Pursuits:  slightly saccadic, possibly WNL for age  Saccades: hypometric/undershoots to L  Convergence/Divergence: WNL   VESTIBULAR - OCULAR REFLEX: deferred ocular reflex testing below due to time  Slow VOR:   VOR Cancellation:   Head-Impulse Test:   Dynamic Visual Acuity:    POSITIONAL TESTING:  Right Dix-Hallpike: POSITIVE, <30 sec duration, delayed onset, R torsional and upbeating nystagmus  Repeated following Epley (see below): still positive, but pt reports dizziness less intense, nystagmus is also less intense in appearance                                                                                                                             TREATMENT DATE:   TA:   Cerivcal AROM measurements: Flexion: 40 deg Extension: 43 deg Rotation  L 45 deg, R 50 deg Lat flexion: L 30 deg, R 40 deg Comments: tightness and soreness throughout, general ROM impairment  : 1056 ft (goal 1200), rates between easy-medium, but does appear somewhat out of breath   MMT: 4+/5   REVIEWED HEP: pt completes the following sets and reps as prescribed in session to confirm correct understanding   Access Code: [email or web address] URL: [email or web address] Date: 10/02/2023 Prepared by: Temple Pacini  Exercises - Seated Upper Trapezius Stretch  - 1 x daily - 5-7 x weekly - 2 sets - 1 reps - 30 sec/side hold - Standing March with Counter Support  - 1 x daily - 5-7 x weekly - 3 sets - 10 reps - Walking  - 1 x daily - 5-7 x weekly - 2 sets - 1 reps - 5 minutes hold        PATIENT EDUCATION: Education details: assessment findings, plan Person educated: Patient Education method: Explanation Education comprehension: verbalized understanding  HOME EXERCISE PROGRAM:  1/6:   Access Code: [email or web address] URL: [email or web address] Date: 09/11/2023 Prepared by: Temple Pacini  Exercises - Standing Single  Leg Stance with Counter Support  - 2 x daily - 5-7 x weekly - 2 sets - 1 reps - 30 seconds/leg hold  - Standing Balance in Corner with Eyes Closed  - 2 x daily - 5-7 x weekly - 2 sets -  1 reps - 30 seconds hold - further instruction to use sturdy chair in front for additoinal support   GOALS: Goals reviewed with patient? Yes  SHORT TERM GOALS: Target date: 11/13/2023    Patient will be independent in home exercise program to improve gait, mobility and balance for better functional independence with ADLs. Baseline: to be initiated as needed next 1-2 visits; 10/02/2023: updated Goal status: ONGOING   LONG TERM GOALS: Target date: 12/25/2023    Patient will increase FOTO score to equal to or greater than 53  to demonstrate statistically significant improvement in mobility and quality of life.  Baseline: 33; 1/9: 51 Goal status: DISCONTINUED  2.  Patient will reduce dizziness handicap inventory score to <50, for less dizziness with ADLs and increased safety with home and work tasks.  Baseline: 1/2: 72 Goal status: INITIAL  3.  Patient will increase ABC scale score >80% to demonstrate better functional mobility and better confidence with ADLs.  Baseline:  Goal status: INITIAL  4.  Patient will increase dynamic gait index score to >19/24 as to demonstrate reduced fall risk and improved dynamic gait balance for better safety with community/home ambulation.   Baseline: scored 22/24 on 09/11/2023 Goal status: MET on 09/11/2023  5. Pt will report 2 weeks without a migraine to improve QOL and ease with ADLs.    Baseline: up to 4/week  Goal status: NEW  6.Patient will increase six minute walk test distance to >1200 ft for progression to community ambulator and improve gait ability  Baseline: 1056 ft  Goal status: NEW    ASSESSMENT:  CLINICAL IMPRESSION: New goals added on this date to address impairments likely impacting frequency of migraines. Improvement of dizziness symptoms has  resulted in decreased frequency per pt report, but pt  continues to experience migraines 2-4x/week. Pt found to have cervical AROM impairments and general reduction in functional capacity. Plan to target these deficits future visits to improve symptoms and sending in recert today.The pt will benefit from further skilled PT to improve impairments in order to increase QOL and improve ease/safety with ADLs.  OBJECTIVE IMPAIRMENTS: Abnormal gait, decreased balance, difficulty walking, dizziness, improper body mechanics, and postural dysfunction.   ACTIVITY LIMITATIONS: bending, stairs, bed mobility, and locomotion level  PARTICIPATION LIMITATIONS: meal prep, cleaning, laundry, driving, shopping, community activity, and yard work  PERSONAL FACTORS: Age, Sex, and 3+ comorbidities: PMH significant for migraines, acquired hypothyroidism, breast CA (L breast, radiation, 2003), L breast surgery 2003, TKA L 2013, Graves disease, HTN, see chart for full details  are also affecting patient's functional outcome.   REHAB POTENTIAL: Good  CLINICAL DECISION MAKING: Evolving/moderate complexity  EVALUATION COMPLEXITY: Moderate   PLAN:  PT FREQUENCY: 1-2x/week  PT DURATION: 12 weeks  PLANNED INTERVENTIONS: 97164- PT Re-evaluation, 97110-Therapeutic exercises, 97530- Therapeutic activity, 97112- Neuromuscular re-education, 97535- Self Care, 52841- Manual therapy, [phone]- Gait training, [phone]- Orthotic Fit/training, [phone]- Canalith repositioning, Patient/Family education, Balance training, Stair training, Joint mobilization, Spinal mobilization, Vestibular training, Cryotherapy, and Moist heat    PLAN FOR NEXT SESSION: reassess DH as needed, manual therapy, conditioning   Baird Kay, PT 10/02/2023, 12:16 PM

## 2023-10-04 ENCOUNTER — Ambulatory Visit: Payer: Medicare PPO

## 2023-10-04 DIAGNOSIS — R262 Difficulty in walking, not elsewhere classified: Secondary | ICD-10-CM

## 2023-10-04 DIAGNOSIS — R42 Dizziness and giddiness: Secondary | ICD-10-CM | POA: Diagnosis not present

## 2023-10-04 DIAGNOSIS — M6281 Muscle weakness (generalized): Secondary | ICD-10-CM

## 2023-10-04 NOTE — Therapy (Signed)
OUTPATIENT PHYSICAL THERAPY VESTIBULAR TREATMENT NOTE     Patient Name: Diane Shea MRN: [id number] DOB:04-08-1951, 73 y.o., female Today's Date: 10/04/2023  END OF SESSION:  PT End of Session - 10/04/23 0810     Visit Number 6    Number of Visits 29    Date for PT Re-Evaluation 12/25/23    PT Start Time 0813    PT Stop Time 0843    PT Time Calculation (min) 30 min    Equipment Utilized During Treatment Gait belt    Activity Tolerance Patient tolerated treatment well    Behavior During Therapy [facility] for tasks assessed/performed               Past Medical History:  Diagnosis Date   Breast cancer (HCC) 2003   left breast cancer, radiation   Cancer (HCC) 2003   Breast   Encounter for screening colonoscopy 04/15/2013   Graves disease    Hypertension 2006   Migraines    Personal history of radiation therapy    Past Surgical History:  Procedure Laterality Date   ABDOMINAL HYSTERECTOMY  2004   BREAST EXCISIONAL BIOPSY Left 2003   sentinel node   BREAST LUMPECTOMY Left 2003   BREAST SURGERY Left 2003   mammosite placement   COLONOSCOPY  2004   CYST EXCISION  2009   vocal cord   FOOT SURGERY     HIP PINNING,CANNULATED Right 04/16/2015   Procedure: CANNULATED HIP PINNING;  Surgeon: Donato Heinz, MD;  Location: [facility] ORS;  Service: Orthopedics;  Laterality: Right;   REPLACEMENT TOTAL KNEE Left 2013   Patient Active Problem List   Diagnosis Date Noted   Dizziness 08/17/2023   Fall 08/17/2023   SAH (subarachnoid hemorrhage) (HCC) 08/17/2023   Pulmonary nodule 08/17/2023   Acquired hypothyroidism 08/17/2023   Headache 08/17/2023   Hip fracture, right (HCC) 04/15/2015   Hip fracture (HCC) 04/15/2015   Encounter for screening colonoscopy 04/15/2013    PCP: Lauro Regulus, MD  REFERRING PROVIDER: Lauro Regulus, MD   REFERRING DIAG: R42 (ICD-10-CM) - Dizziness and giddiness   THERAPY DIAG:  Muscle weakness (generalized)  Difficulty in  walking, not elsewhere classified  ONSET DATE: 08/17/2023  Rationale for Evaluation and Treatment: Rehabilitation  SUBJECTIVE:   SUBJECTIVE STATEMENT:  Pt doing OK today, feeling frazzled due to being late getting here. She reports no pain.  Pt accompanied by: self  PERTINENT HISTORY: Pt is a pleasant 73 yo female referred to PT for dizziness. Pt admitted to [facility] 12/12-12/13/24 due to dizziness following mechanical fall on stairs with LOC. Pt found to have trace R frontal subarachnoid hemorrhage. Pt has also since been diagnosed with a concussion. While in hospital pt exhibited positive Dix-Hallpike and improved following Epley maneuvers with recommendation for outpatient PT follow-up. Pt reports maneuvers helped and that L & R sides were both treated. After this she reports she was d/c home. She reports no other vertigo episodes like what she was experiencing before, but reports difficulty turning her head occ and that this makes her woozy/unsteady, this is worse with fatigue. She has been sleeping in a recliner/not yet slept flat. Pt seeing family tomorrow for the holidays, lives with spouse, would like to improve her symptoms. No other falls since the 1 that brought her to the hospital. Pt did report neck soreness following hospital stay, but OK now. PMH significant for migraines, acquired hypothyroidism, breast CA (L breast, radiation, 2003), L breast surgery 2003, TKA L  2013, Graves disease, HTN, see chart for full details  Now being treated to reduce migraine symptoms.  PAIN:  Are you having pain? No  PRECAUTIONS: Fall  RED FLAGS: None   WEIGHT BEARING RESTRICTIONS: No  FALLS: Has patient fallen in last 6 months? Yes. Number of falls 1  LIVING ENVIRONMENT: Lives with: lives with their spouse   PLOF: Independent  PATIENT GOALS: improve her symptoms  OBJECTIVE:  Note: Objective measures were completed at Evaluation unless otherwise noted.  DIAGNOSTIC FINDINGS:     CT Cervical Spine 08/17/2023: "  IMPRESSION: 1. Trace right frontal subarachnoid hemorrhage. No mass effect or midline shift. 2. No acute/traumatic cervical spine pathology. 3. An 11 mm left apical subpleural nodule. Dedicated chest CT is recommended for further evaluation.   These results were called by telephone at the time of interpretation on 08/17/2023 at 8:25 pm to DR [facility], who verbally acknowledged these results."     Electronically Signed   By: Elgie Collard M.D.   On: 08/17/2023 20:27"  CT HEAD 08/17/23:  "IMPRESSION: 1. Trace right frontal subarachnoid hemorrhage. No mass effect or midline shift. 2. No acute/traumatic cervical spine pathology. 3. An 11 mm left apical subpleural nodule. Dedicated chest CT is recommended for further evaluation.   These results were called by telephone at the time of interpretation on 08/17/2023 at 8:25 pm to DR [facility], who verbally acknowledged these results.     Electronically Signed   By: Elgie Collard M.D.   On: 08/17/2023 20:27"  MR BRAIN 06/14/2023: "IMPRESSION: No acute or reversible finding. No cause of headache identified. Minimal small vessel change of the cerebral hemispheric white matter, less than often seen at this age.     Electronically Signed   By: Paulina Fusi M.D."   On: 06/14/2023 15:04  COGNITION: Overall cognitive status: Within functional limits for tasks assessed   POSTURE:  Very slight crouched posture, possibly due to unsteadiness  Cervical ROM:   Quick cervical AROM screen indicates pt has sufficient pain-free AROM (rotation, ext/flex) needed for vestibular positional testing  STRENGTH: not assessed today  BED MOBILITY:  Currently impaired due to sx (pt not sleeping in bed, she is currently sleeping in recliner)  TRANSFERS: Assistive device utilized: Single point cane  Sit to stand: SBA and CGA Stand to sit: Complete Independence Chair to chair: SBA    GAIT: Gait pattern:   currently guarded due to feelings of unsteadiness, pt using SPC, scanning impaired per pt hx due to unsteadiness with head turns Distance walked: clinic distances Assistive device utilized: Single point cane Level of assistance: Modified independence and CGA   FUNCTIONAL TESTS:  DGI and MCTSIB to be performed future visit as needed  PATIENT SURVEYS:  FOTO 33 (goal 53)  VESTIBULAR ASSESSMENT:  GENERAL OBSERVATION: cautious gait with SPC due to unsteadiness    SYMPTOM BEHAVIOR:  Subjective history: vertigo s/p fall with concussion, improved after maneuvers provided in hospital to R and L sides (see above)  Non-Vestibular symptoms: headaches and nausea/vomiting  Type of dizziness:  previously vertigo, now more woozy/unsteady, particularly with head turns  Frequency: with particular movements  Relieving factors:  pt not currently lying flat/sleeping in recliner to avoid sx  Progression of symptoms: better since Epley maneuvers provided in hospital  OCULOMOTOR EXAM:  Ocular Alignment: normal  Ocular ROM: No Limitations  Spontaneous Nystagmus: absent  Gaze-Induced Nystagmus: right beating with right gaze  Smooth Pursuits:  slightly saccadic, possibly WNL for age  Saccades: hypometric/undershoots to  L  Convergence/Divergence: WNL   VESTIBULAR - OCULAR REFLEX: deferred ocular reflex testing below due to time  Slow VOR:   VOR Cancellation:   Head-Impulse Test:   Dynamic Visual Acuity:    POSITIONAL TESTING:  Right Dix-Hallpike: POSITIVE, <30 sec duration, delayed onset, R torsional and upbeating nystagmus  Repeated following Epley (see below): still positive, but pt reports dizziness less intense, nystagmus is also less intense in appearance                                                                                                                             TREATMENT DATE:   TE: UT trap stretch 2x30 sec each side Cervical rotation stretch 2x30 sec each side  Cervical  ext stretch x 60 sec Cervical flex stretch x 60 sec  Seated shrugs 2x10 bilat UE  Seated scapular squeezes  2x10 with 3 sec hold/rep bilat UE-  feels a good stretch, but also notes some tightness in shoulders   Wall push-up 2x10  Chin tuck, standing 10x with cues   Standing hip abduction 2x10 each LE  Standing march 2x10 each LE   Fast gait for cardio performance/endurance 1302 ft in 5 minutes of ambulation. Pt rates moderate challenge     PATIENT EDUCATION: Education details: Pt educated throughout session about proper posture and technique with exercises. Improved exercise technique, movement at target joints, use of target muscles after min to mod verbal, visual, tactile cues.  Person educated: Patient Education method: Explanation Education comprehension: verbalized understanding  HOME EXERCISE PROGRAM:  Access Code: [email or web address] URL: [email or web address] Date: 10/02/2023 Prepared by: Temple Pacini   Exercises - Seated Upper Trapezius Stretch  - 1 x daily - 5-7 x weekly - 2 sets - 1 reps - 30 sec/side hold - Standing March with Counter Support  - 1 x daily - 5-7 x weekly - 3 sets - 10 reps - Walking  - 1 x daily - 5-7 x weekly - 2 sets - 1 reps - 5 minutes hold      1/6:   Access Code: [email or web address] URL: [email or web address] Date: 09/11/2023 Prepared by: Temple Pacini  Exercises - Standing Single Leg Stance with Counter Support  - 2 x daily - 5-7 x weekly - 2 sets - 1 reps - 30 seconds/leg hold  - Standing Balance in Corner with Eyes Closed  - 2 x daily - 5-7 x weekly - 2 sets - 1 reps - 30 seconds hold - further instruction to use sturdy chair in front for additoinal support   GOALS: Goals reviewed with patient? Yes  SHORT TERM GOALS: Target date: 11/13/2023    Patient will be independent in home exercise program to improve gait, mobility and balance for better functional independence with ADLs. Baseline: to be initiated as needed next  1-2 visits; 10/02/2023: updated Goal status: ONGOING   LONG TERM GOALS: Target date: 12/25/2023    Patient will increase FOTO score to equal to or  greater than 53  to demonstrate statistically significant improvement in mobility and quality of life.  Baseline: 33; 1/9: 51 Goal status: DISCONTINUED  2.  Patient will reduce dizziness handicap inventory score to <50, for less dizziness with ADLs and increased safety with home and work tasks.  Baseline: 1/2: 72 Goal status: INITIAL  3.  Patient will increase ABC scale score >80% to demonstrate better functional mobility and better confidence with ADLs.  Baseline:  Goal status: INITIAL  4.  Patient will increase dynamic gait index score to >19/24 as to demonstrate reduced fall risk and improved dynamic gait balance for better safety with community/home ambulation.   Baseline: scored 22/24 on 09/11/2023 Goal status: MET on 09/11/2023  5. Pt will report 2 weeks without a migraine to improve QOL and ease with ADLs.    Baseline: up to 4/week  Goal status: NEW  6.Patient will increase six minute walk test distance to >1200 ft for progression to community ambulator and improve gait ability  Baseline: 1056 ft  Goal status: NEW    ASSESSMENT:  CLINICAL IMPRESSION: Appointment dedicated to gentle mobility/strenghtneing and stretching of cervical spine. Pt notes sensation of tightness throughout in shoulders and up behind ears, but no pain. Pt still challenged with endurance-based activities. The pt will benefit from further skilled PT to improve impairments in order to increase QOL and improve ease/safety with ADLs.  OBJECTIVE IMPAIRMENTS: Abnormal gait, decreased balance, difficulty walking, dizziness, improper body mechanics, and postural dysfunction.   ACTIVITY LIMITATIONS: bending, stairs, bed mobility, and locomotion level  PARTICIPATION LIMITATIONS: meal prep, cleaning, laundry, driving, shopping, community activity, and yard  work  PERSONAL FACTORS: Age, Sex, and 3+ comorbidities: PMH significant for migraines, acquired hypothyroidism, breast CA (L breast, radiation, 2003), L breast surgery 2003, TKA L 2013, Graves disease, HTN, see chart for full details  are also affecting patient's functional outcome.   REHAB POTENTIAL: Good  CLINICAL DECISION MAKING: Evolving/moderate complexity  EVALUATION COMPLEXITY: Moderate   PLAN:  PT FREQUENCY: 1-2x/week  PT DURATION: 12 weeks  PLANNED INTERVENTIONS: 97164- PT Re-evaluation, 97110-Therapeutic exercises, 97530- Therapeutic activity, 97112- Neuromuscular re-education, 97535- Self Care, 16109- Manual therapy, [phone]- Gait training, [phone]- Orthotic Fit/training, [phone]- Canalith repositioning, Patient/Family education, Balance training, Stair training, Joint mobilization, Spinal mobilization, Vestibular training, Cryotherapy, and Moist heat    PLAN FOR NEXT SESSION: reassess DH as needed, manual therapy, conditioning   Baird Kay, PT 10/04/2023, 8:45 AM

## 2023-10-09 ENCOUNTER — Ambulatory Visit: Payer: Medicare PPO | Attending: Internal Medicine

## 2023-10-09 DIAGNOSIS — R262 Difficulty in walking, not elsewhere classified: Secondary | ICD-10-CM | POA: Diagnosis present

## 2023-10-09 DIAGNOSIS — M6281 Muscle weakness (generalized): Secondary | ICD-10-CM | POA: Insufficient documentation

## 2023-10-09 DIAGNOSIS — M542 Cervicalgia: Secondary | ICD-10-CM | POA: Diagnosis present

## 2023-10-09 NOTE — Therapy (Signed)
OUTPATIENT PHYSICAL THERAPY VESTIBULAR TREATMENT NOTE     Patient Name: Diane Shea MRN: [id number] DOB:1950-10-30, 73 y.o., female Today's Date: 10/09/2023  END OF SESSION:  PT End of Session - 10/09/23 0807     Visit Number 7    Number of Visits 29    Date for PT Re-Evaluation 12/25/23    PT Start Time 0805    PT Stop Time 0846    PT Time Calculation (min) 41 min    Equipment Utilized During Treatment Gait belt    Activity Tolerance Patient tolerated treatment well;No increased pain    Behavior During Therapy [facility] for tasks assessed/performed                Past Medical History:  Diagnosis Date   Breast cancer (HCC) 2003   left breast cancer, radiation   Cancer (HCC) 2003   Breast   Encounter for screening colonoscopy 04/15/2013   Graves disease    Hypertension 2006   Migraines    Personal history of radiation therapy    Past Surgical History:  Procedure Laterality Date   ABDOMINAL HYSTERECTOMY  2004   BREAST EXCISIONAL BIOPSY Left 2003   sentinel node   BREAST LUMPECTOMY Left 2003   BREAST SURGERY Left 2003   mammosite placement   COLONOSCOPY  2004   CYST EXCISION  2009   vocal cord   FOOT SURGERY     HIP PINNING,CANNULATED Right 04/16/2015   Procedure: CANNULATED HIP PINNING;  Surgeon: Donato Heinz, MD;  Location: [facility] ORS;  Service: Orthopedics;  Laterality: Right;   REPLACEMENT TOTAL KNEE Left 2013   Patient Active Problem List   Diagnosis Date Noted   Dizziness 08/17/2023   Fall 08/17/2023   SAH (subarachnoid hemorrhage) (HCC) 08/17/2023   Pulmonary nodule 08/17/2023   Acquired hypothyroidism 08/17/2023   Headache 08/17/2023   Hip fracture, right (HCC) 04/15/2015   Hip fracture (HCC) 04/15/2015   Encounter for screening colonoscopy 04/15/2013    PCP: Lauro Regulus, MD  REFERRING PROVIDER: Lauro Regulus, MD   REFERRING DIAG: R42 (ICD-10-CM) - Dizziness and giddiness   THERAPY DIAG:  Cervicalgia  Muscle  weakness (generalized)  ONSET DATE: 08/17/2023  Rationale for Evaluation and Treatment: Rehabilitation  SUBJECTIVE:   SUBJECTIVE STATEMENT:  Pt reports she is doing well, but feels tight. Finds she tends to tense her shoulders. She reports no headaches she has had to take meds for since last Wednesday. She did feel like a headache was coming on the other day, reports she relaxed and didn't do "anything for hours." She reports not really having any negative symptoms.  Pt accompanied by: self  PERTINENT HISTORY: Pt is a pleasant 74 yo female referred to PT for dizziness. Pt admitted to [facility] 12/12-12/13/24 due to dizziness following mechanical fall on stairs with LOC. Pt found to have trace R frontal subarachnoid hemorrhage. Pt has also since been diagnosed with a concussion. While in hospital pt exhibited positive Dix-Hallpike and improved following Epley maneuvers with recommendation for outpatient PT follow-up. Pt reports maneuvers helped and that L & R sides were both treated. After this she reports she was d/c home. She reports no other vertigo episodes like what she was experiencing before, but reports difficulty turning her head occ and that this makes her woozy/unsteady, this is worse with fatigue. She has been sleeping in a recliner/not yet slept flat. Pt seeing family tomorrow for the holidays, lives with spouse, would like to improve her symptoms.  No other falls since the 1 that brought her to the hospital. Pt did report neck soreness following hospital stay, but OK now. PMH significant for migraines, acquired hypothyroidism, breast CA (L breast, radiation, 2003), L breast surgery 2003, TKA L 2013, Graves disease, HTN, see chart for full details  Now being treated to reduce migraine symptoms.  PAIN:  Are you having pain? No  PRECAUTIONS: Fall  RED FLAGS: None   WEIGHT BEARING RESTRICTIONS: No  FALLS: Has patient fallen in last 6 months? Yes. Number of falls 1  LIVING  ENVIRONMENT: Lives with: lives with their spouse   PLOF: Independent  PATIENT GOALS: improve her symptoms  OBJECTIVE:  Note: Objective measures were completed at Evaluation unless otherwise noted.  DIAGNOSTIC FINDINGS:    CT Cervical Spine 08/17/2023: "  IMPRESSION: 1. Trace right frontal subarachnoid hemorrhage. No mass effect or midline shift. 2. No acute/traumatic cervical spine pathology. 3. An 11 mm left apical subpleural nodule. Dedicated chest CT is recommended for further evaluation.   These results were called by telephone at the time of interpretation on 08/17/2023 at 8:25 pm to DR [facility], who verbally acknowledged these results."     Electronically Signed   By: Elgie Collard M.D.   On: 08/17/2023 20:27"  CT HEAD 08/17/23:  "IMPRESSION: 1. Trace right frontal subarachnoid hemorrhage. No mass effect or midline shift. 2. No acute/traumatic cervical spine pathology. 3. An 11 mm left apical subpleural nodule. Dedicated chest CT is recommended for further evaluation.   These results were called by telephone at the time of interpretation on 08/17/2023 at 8:25 pm to DR [facility], who verbally acknowledged these results.     Electronically Signed   By: Elgie Collard M.D.   On: 08/17/2023 20:27"  MR BRAIN 06/14/2023: "IMPRESSION: No acute or reversible finding. No cause of headache identified. Minimal small vessel change of the cerebral hemispheric white matter, less than often seen at this age.     Electronically Signed   By: Paulina Fusi M.D."   On: 06/14/2023 15:04  COGNITION: Overall cognitive status: Within functional limits for tasks assessed   POSTURE:  Very slight crouched posture, possibly due to unsteadiness  Cervical ROM:   Quick cervical AROM screen indicates pt has sufficient pain-free AROM (rotation, ext/flex) needed for vestibular positional testing  STRENGTH: not assessed today  BED MOBILITY:  Currently impaired due to sx (pt not  sleeping in bed, she is currently sleeping in recliner)  TRANSFERS: Assistive device utilized: Single point cane  Sit to stand: SBA and CGA Stand to sit: Complete Independence Chair to chair: SBA    GAIT: Gait pattern:  currently guarded due to feelings of unsteadiness, pt using SPC, scanning impaired per pt hx due to unsteadiness with head turns Distance walked: clinic distances Assistive device utilized: Single point cane Level of assistance: Modified independence and CGA   FUNCTIONAL TESTS:  DGI and MCTSIB to be performed future visit as needed  PATIENT SURVEYS:  FOTO 33 (goal 53)  VESTIBULAR ASSESSMENT:  GENERAL OBSERVATION: cautious gait with SPC due to unsteadiness    SYMPTOM BEHAVIOR:  Subjective history: vertigo s/p fall with concussion, improved after maneuvers provided in hospital to R and L sides (see above)  Non-Vestibular symptoms: headaches and nausea/vomiting  Type of dizziness:  previously vertigo, now more woozy/unsteady, particularly with head turns  Frequency: with particular movements  Relieving factors:  pt not currently lying flat/sleeping in recliner to avoid sx  Progression of symptoms: better since Epley maneuvers  provided in hospital  OCULOMOTOR EXAM:  Ocular Alignment: normal  Ocular ROM: No Limitations  Spontaneous Nystagmus: absent  Gaze-Induced Nystagmus: right beating with right gaze  Smooth Pursuits:  slightly saccadic, possibly WNL for age  Saccades: hypometric/undershoots to L  Convergence/Divergence: WNL   VESTIBULAR - OCULAR REFLEX: deferred ocular reflex testing below due to time  Slow VOR:   VOR Cancellation:   Head-Impulse Test:   Dynamic Visual Acuity:    POSITIONAL TESTING:  Right Dix-Hallpike: POSITIVE, <30 sec duration, delayed onset, R torsional and upbeating nystagmus  Repeated following Epley (see below): still positive, but pt reports dizziness less intense, nystagmus is also less intense in appearance                                                                                                                              TREATMENT DATE:   NMR: postural retraining and modulation of symptoms UT traps stretch 2x30 -50 sec each side Cervical rotation stretch 2x30 sec each side  Cervical ext stretch x 60 sec  Cervical rotation 8x - completed following manual therapy   Seated shrugs 1x10 bilat UE   Seated chin tucks 10x with 3 sec hold/rep - mirror used as cue for improved technique  Manual: Seated, heat donned each shoulder (skin WNL prior to and upon removal of heat, pt reports heat feels good, no adverse reaction) - STM provided to bilat UT, posterior shoulder mm and bilat cervical paraspinals.  TrPs throughout   TA: Nustep - for cardio performance/improved activity tolerance  Lvl 1-3 x 5 min, SPM 50s-60s. Pt adjusts intensity and monitors pt for response Provided education on cavitations with interventions, intervention response     PATIENT EDUCATION: Education details: Pt educated throughout session about proper posture and technique with exercises. Improved exercise technique, movement at target joints, use of target muscles after min to mod verbal, visual, tactile cues.  Person educated: Patient Education method: Explanation Education comprehension: verbalized understanding  HOME EXERCISE PROGRAM:  Access Code: [email or web address] URL: [email or web address] Date: 10/02/2023 Prepared by: Temple Pacini   Exercises - Seated Upper Trapezius Stretch  - 1 x daily - 5-7 x weekly - 2 sets - 1 reps - 30 sec/side hold - Standing March with Counter Support  - 1 x daily - 5-7 x weekly - 3 sets - 10 reps - Walking  - 1 x daily - 5-7 x weekly - 2 sets - 1 reps - 5 minutes hold      1/6:   Access Code: [email or web address] URL: [email or web address] Date: 09/11/2023 Prepared by: Temple Pacini  Exercises - Standing Single Leg Stance with Counter Support  - 2 x daily - 5-7 x weekly - 2  sets - 1 reps - 30 seconds/leg hold  - Standing Balance in Corner with Eyes Closed  - 2 x daily - 5-7 x weekly - 2 sets - 1 reps - 30 seconds hold - further instruction to use sturdy chair in  front for additoinal support   GOALS: Goals reviewed with patient? Yes  SHORT TERM GOALS: Target date: 11/13/2023    Patient will be independent in home exercise program to improve gait, mobility and balance for better functional independence with ADLs. Baseline: to be initiated as needed next 1-2 visits; 10/02/2023: updated Goal status: ONGOING   LONG TERM GOALS: Target date: 12/25/2023    Patient will increase FOTO score to equal to or greater than 53  to demonstrate statistically significant improvement in mobility and quality of life.  Baseline: 33; 1/9: 51 Goal status: DISCONTINUED  2.  Patient will reduce dizziness handicap inventory score to <50, for less dizziness with ADLs and increased safety with home and work tasks.  Baseline: 1/2: 72 Goal status: INITIAL  3.  Patient will increase ABC scale score >80% to demonstrate better functional mobility and better confidence with ADLs.  Baseline:  Goal status: INITIAL  4.  Patient will increase dynamic gait index score to >19/24 as to demonstrate reduced fall risk and improved dynamic gait balance for better safety with community/home ambulation.   Baseline: scored 22/24 on 09/11/2023 Goal status: MET on 09/11/2023  5. Pt will report 2 weeks without a migraine to improve QOL and ease with ADLs.    Baseline: up to 4/week  Goal status: NEW  6.Patient will increase six minute walk test distance to >1200 ft for progression to community ambulator and improve gait ability  Baseline: 1056 ft  Goal status: NEW    ASSESSMENT:  CLINICAL IMPRESSION: Initiated manual therapy to address TrPs. Pt found to have these bilaterally, but responded well to manual reporting productive feeling of soreness to these regions. Some time spent on providing  education for chin tuck technique with use of mirror cue as pt not yet performing these correctly. Will continue to provide such education as needed. The pt will benefit from further skilled PT to improve impairments in order to increase QOL and improve ease/safety with ADLs.  OBJECTIVE IMPAIRMENTS: Abnormal gait, decreased balance, difficulty walking, dizziness, improper body mechanics, and postural dysfunction.   ACTIVITY LIMITATIONS: bending, stairs, bed mobility, and locomotion level  PARTICIPATION LIMITATIONS: meal prep, cleaning, laundry, driving, shopping, community activity, and yard work  PERSONAL FACTORS: Age, Sex, and 3+ comorbidities: PMH significant for migraines, acquired hypothyroidism, breast CA (L breast, radiation, 2003), L breast surgery 2003, TKA L 2013, Graves disease, HTN, see chart for full details  are also affecting patient's functional outcome.   REHAB POTENTIAL: Good  CLINICAL DECISION MAKING: Evolving/moderate complexity  EVALUATION COMPLEXITY: Moderate   PLAN:  PT FREQUENCY: 1-2x/week  PT DURATION: 12 weeks  PLANNED INTERVENTIONS: 97164- PT Re-evaluation, 97110-Therapeutic exercises, 97530- Therapeutic activity, 97112- Neuromuscular re-education, 97535- Self Care, 96045- Manual therapy, [phone]- Gait training, [phone]- Orthotic Fit/training, [phone]- Canalith repositioning, Patient/Family education, Balance training, Stair training, Joint mobilization, Spinal mobilization, Vestibular training, Cryotherapy, and Moist heat    PLAN FOR NEXT SESSION: reassess DH as needed, manual therapy, conditioning   Baird Kay, PT 10/09/2023, 9:41 AM

## 2023-10-11 ENCOUNTER — Ambulatory Visit: Payer: Medicare PPO

## 2023-10-11 DIAGNOSIS — R262 Difficulty in walking, not elsewhere classified: Secondary | ICD-10-CM

## 2023-10-11 DIAGNOSIS — M6281 Muscle weakness (generalized): Secondary | ICD-10-CM

## 2023-10-11 DIAGNOSIS — M542 Cervicalgia: Secondary | ICD-10-CM | POA: Diagnosis not present

## 2023-10-11 NOTE — Therapy (Signed)
 OUTPATIENT PHYSICAL THERAPY VESTIBULAR TREATMENT NOTE     Patient Name: Diane Shea MRN: [id number] DOB:[birth year], 73 y.o., female Today's Date: 10/11/2023  END OF SESSION:       Past Medical History:  Diagnosis Date   Breast cancer (HCC) 2003   left breast cancer, radiation   Cancer (HCC) 2003   Breast   Encounter for screening colonoscopy 04/15/2013   Graves disease    Hypertension 2006   Migraines    Personal history of radiation therapy    Past Surgical History:  Procedure Laterality Date   ABDOMINAL HYSTERECTOMY  2004   BREAST EXCISIONAL BIOPSY Left 2003   sentinel node   BREAST LUMPECTOMY Left 2003   BREAST SURGERY Left 2003   mammosite placement   COLONOSCOPY  2004   CYST EXCISION  2009   vocal cord   FOOT SURGERY     HIP PINNING,CANNULATED Right 04/16/2015   Procedure: CANNULATED HIP PINNING;  Surgeon: Lynwood SHAUNNA Hue, MD;  Location: [facility] ORS;  Service: Orthopedics;  Laterality: Right;   REPLACEMENT TOTAL KNEE Left 2013   Patient Active Problem List   Diagnosis Date Noted   Dizziness 08/17/2023   Fall 08/17/2023   SAH (subarachnoid hemorrhage) (HCC) 08/17/2023   Pulmonary nodule 08/17/2023   Acquired hypothyroidism 08/17/2023   Headache 08/17/2023   Hip fracture, right (HCC) 04/15/2015   Hip fracture (HCC) 04/15/2015   Encounter for screening colonoscopy 04/15/2013    PCP: Lenon Layman ORN, MD  REFERRING PROVIDER: Lenon Layman ORN, MD   REFERRING DIAG: R42 (ICD-10-CM) - Dizziness and giddiness   THERAPY DIAG:  No diagnosis found.  ONSET DATE: 08/17/2023  Rationale for Evaluation and Treatment: Rehabilitation  SUBJECTIVE:   SUBJECTIVE STATEMENT:  Pt states difficulty staying asleep last night. She reports yesterday she started getting some aching through her gums, says this happens prior to headaches, but once she started drinking water, relaxing her symptoms went away. No balance or issues with dizziness.   Pt  accompanied by: self  PERTINENT HISTORY: Pt is a pleasant 73 yo female referred to PT for dizziness. Pt admitted to [facility] 12/12-12/13/24 due to dizziness following mechanical fall on stairs with LOC. Pt found to have trace R frontal subarachnoid hemorrhage. Pt has also since been diagnosed with a concussion. While in hospital pt exhibited positive Dix-Hallpike and improved following Epley maneuvers with recommendation for outpatient PT follow-up. Pt reports maneuvers helped and that L & R sides were both treated. After this she reports she was d/c home. She reports no other vertigo episodes like what she was experiencing before, but reports difficulty turning her head occ and that this makes her woozy/unsteady, this is worse with fatigue. She has been sleeping in a recliner/not yet slept flat. Pt seeing family tomorrow for the holidays, lives with spouse, would like to improve her symptoms. No other falls since the 1 that brought her to the hospital. Pt did report neck soreness following hospital stay, but OK now. PMH significant for migraines, acquired hypothyroidism, breast CA (L breast, radiation, 2003), L breast surgery 2003, TKA L 2013, Graves disease, HTN, see chart for full details  Now being treated to reduce migraine symptoms.  PAIN:  Are you having pain? No  PRECAUTIONS: Fall  RED FLAGS: None   WEIGHT BEARING RESTRICTIONS: No  FALLS: Has patient fallen in last 6 months? Yes. Number of falls 1  LIVING ENVIRONMENT: Lives with: lives with their spouse   PLOF: Independent  PATIENT GOALS: improve her  symptoms  OBJECTIVE:  Note: Objective measures were completed at Evaluation unless otherwise noted.  DIAGNOSTIC FINDINGS:    CT Cervical Spine 08/17/2023:   IMPRESSION: 1. Trace right frontal subarachnoid hemorrhage. No mass effect or midline shift. 2. No acute/traumatic cervical spine pathology. 3. An 11 mm left apical subpleural nodule. Dedicated chest CT  is recommended for further evaluation.   These results were called by telephone at the time of interpretation on 08/17/2023 at 8:25 pm to DR [facility], who verbally acknowledged these results.     Electronically Signed   By: Vanetta Chou M.D.   On: 08/17/2023 20:27  CT HEAD 08/17/23:  IMPRESSION: 1. Trace right frontal subarachnoid hemorrhage. No mass effect or midline shift. 2. No acute/traumatic cervical spine pathology. 3. An 11 mm left apical subpleural nodule. Dedicated chest CT is recommended for further evaluation.   These results were called by telephone at the time of interpretation on 08/17/2023 at 8:25 pm to DR [facility] [facility], who verbally acknowledged these results.     Electronically Signed   By: Vanetta Chou M.D.   On: 08/17/2023 20:27  MR BRAIN 06/14/2023: IMPRESSION: No acute or reversible finding. No cause of headache identified. Minimal small vessel change of the cerebral hemispheric white matter, less than often seen at this age.     Electronically Signed   By: Oneil Officer M.D.   On: 06/14/2023 15:04  COGNITION: Overall cognitive status: Within functional limits for tasks assessed   POSTURE:  Very slight crouched posture, possibly due to unsteadiness  Cervical ROM:   Quick cervical AROM screen indicates pt has sufficient pain-free AROM (rotation, ext/flex) needed for vestibular positional testing  STRENGTH: not assessed today  BED MOBILITY:  Currently impaired due to sx (pt not sleeping in bed, she is currently sleeping in recliner)  TRANSFERS: Assistive device utilized: Single point cane  Sit to stand: SBA and CGA Stand to sit: Complete Independence Chair to chair: SBA    GAIT: Gait pattern:  currently guarded due to feelings of unsteadiness, pt using SPC, scanning impaired per pt hx due to unsteadiness with head turns Distance walked: clinic distances Assistive device utilized: Single point cane Level of assistance: Modified  independence and CGA   FUNCTIONAL TESTS:  DGI and MCTSIB to be performed future visit as needed  PATIENT SURVEYS:  FOTO 33 (goal 53)  VESTIBULAR ASSESSMENT:  GENERAL OBSERVATION: cautious gait with SPC due to unsteadiness    SYMPTOM BEHAVIOR:  Subjective history: vertigo s/p fall with concussion, improved after maneuvers provided in hospital to R and L sides (see above)  Non-Vestibular symptoms: headaches and nausea/vomiting  Type of dizziness:  previously vertigo, now more woozy/unsteady, particularly with head turns  Frequency: with particular movements  Relieving factors:  pt not currently lying flat/sleeping in recliner to avoid sx  Progression of symptoms: better since Epley maneuvers provided in hospital  OCULOMOTOR EXAM:  Ocular Alignment: normal  Ocular ROM: No Limitations  Spontaneous Nystagmus: absent  Gaze-Induced Nystagmus: right beating with right gaze  Smooth Pursuits:  slightly saccadic, possibly WNL for age  Saccades: hypometric/undershoots to L  Convergence/Divergence: WNL   VESTIBULAR - OCULAR REFLEX: deferred ocular reflex testing below due to time  Slow VOR:   VOR Cancellation:   Head-Impulse Test:   Dynamic Visual Acuity:    POSITIONAL TESTING:  Right Dix-Hallpike: POSITIVE, <30 sec duration, delayed onset, R torsional and upbeating nystagmus  Repeated following Epley (see below): still positive, but pt reports dizziness less intense, nystagmus is also  less intense in appearance                                                                                                                             TREATMENT DATE:   NMR: postural retraining and modulation of symptoms  Seated chin tucks 10x with 3 sec hold/rep - mirror used as cue for improved technique Heat donned to each shoulder as pt performs the following (no adverse reaction to heat): Cervical rotation stretch x60 sec each side  Cervical ext stretch x 60 sec UT traps stretch x60 sec each  side Seated cervical flexion with rhomboids stretch x 30 sec   1.5# bar overhead press (seated) 2x10s strengthening and postural re-ed   TA: Fast gait with vertical and horizontal head turn x 5 minutes to assess balance, sx response and for cardio performance Shoulder isometrics with towel ext/flex 10x for each each UE  Standing shoulder shrug with 3# followed by UT stretch 2x10  Standing pec stretch 60 sec each side - R tighter     PATIENT EDUCATION: Education details: Pt educated throughout session about proper posture and technique with exercises. Improved exercise technique, movement at target joints, use of target muscles after min to mod verbal, visual, tactile cues.  Person educated: Patient Education method: Explanation Education comprehension: verbalized understanding  HOME EXERCISE PROGRAM:  Access Code: [email or web address] URL: [email or web address] Date: 10/02/2023 Prepared by: Darryle Patten   Exercises - Seated Upper Trapezius Stretch  - 1 x daily - 5-7 x weekly - 2 sets - 1 reps - 30 sec/side hold - Standing March with Counter Support  - 1 x daily - 5-7 x weekly - 3 sets - 10 reps - Walking  - 1 x daily - 5-7 x weekly - 2 sets - 1 reps - 5 minutes hold      1/6:   Access Code: [email or web address] URL: [email or web address] Date: 09/11/2023 Prepared by: Darryle Patten  Exercises - Standing Single Leg Stance with Counter Support  - 2 x daily - 5-7 x weekly - 2 sets - 1 reps - 30 seconds/leg hold  - Standing Balance in Corner with Eyes Closed  - 2 x daily - 5-7 x weekly - 2 sets - 1 reps - 30 seconds hold - further instruction to use sturdy chair in front for additoinal support   GOALS: Goals reviewed with patient? Yes  SHORT TERM GOALS: Target date: 11/13/2023    Patient will be independent in home exercise program to improve gait, mobility and balance for better functional independence with ADLs. Baseline: to be initiated as needed next 1-2 visits;  10/02/2023: updated Goal status: ONGOING   LONG TERM GOALS: Target date: 12/25/2023    Patient will increase FOTO score to equal to or greater than 53  to demonstrate statistically significant improvement in mobility and quality of life.  Baseline: 33; 1/9: 51 Goal status: DISCONTINUED  2.  Patient will reduce dizziness handicap inventory score to <50, for  less dizziness with ADLs and increased safety with home and work tasks.  Baseline: 1/2: 72 Goal status: INITIAL  3.  Patient will increase ABC scale score >80% to demonstrate better functional mobility and better confidence with ADLs.  Baseline:  Goal status: INITIAL  4.  Patient will increase dynamic gait index score to >19/24 as to demonstrate reduced fall risk and improved dynamic gait balance for better safety with community/home ambulation.   Baseline: scored 22/24 on 09/11/2023 Goal status: MET on 09/11/2023  5. Pt will report 2 weeks without a migraine to improve QOL and ease with ADLs.    Baseline: up to 4/week  Goal status: NEW  6.Patient will increase six minute walk test distance to >1200 ft for progression to community ambulator and improve gait ability  Baseline: 1056 ft  Goal status: NEW    ASSESSMENT:  CLINICAL IMPRESSION: Continued progression of postural and UE mobility/strengthening to target possible migraine triggers. Pt still fatigued with fast gait activity, but exhibits good balance/no symptoms with addition of head turns. The pt will benefit from further skilled PT to improve impairments in order to increase QOL and improve ease/safety with ADLs.  OBJECTIVE IMPAIRMENTS: Abnormal gait, decreased balance, difficulty walking, dizziness, improper body mechanics, and postural dysfunction.   ACTIVITY LIMITATIONS: bending, stairs, bed mobility, and locomotion level  PARTICIPATION LIMITATIONS: meal prep, cleaning, laundry, driving, shopping, community activity, and yard work  PERSONAL FACTORS: Age, Sex, and  3+ comorbidities: PMH significant for migraines, acquired hypothyroidism, breast CA (L breast, radiation, 2003), L breast surgery 2003, TKA L 2013, Graves disease, HTN, see chart for full details  are also affecting patient's functional outcome.   REHAB POTENTIAL: Good  CLINICAL DECISION MAKING: Evolving/moderate complexity  EVALUATION COMPLEXITY: Moderate   PLAN:  PT FREQUENCY: 1-2x/week  PT DURATION: 12 weeks  PLANNED INTERVENTIONS: 97164- PT Re-evaluation, 97110-Therapeutic exercises, 97530- Therapeutic activity, 97112- Neuromuscular re-education, 97535- Self Care, [id number]- Manual therapy, [phone]- Gait training, [phone]- Orthotic Fit/training, [phone]- Canalith repositioning, Patient/Family education, Balance training, Stair training, Joint mobilization, Spinal mobilization, Vestibular training, Cryotherapy, and Moist heat    PLAN FOR NEXT SESSION: reassess DH as needed, manual therapy, conditioning   Darryle JONELLE Patten, PT 10/11/2023, 8:07 AM

## 2023-10-16 ENCOUNTER — Ambulatory Visit: Payer: Medicare PPO

## 2023-10-16 DIAGNOSIS — M6281 Muscle weakness (generalized): Secondary | ICD-10-CM

## 2023-10-16 DIAGNOSIS — R262 Difficulty in walking, not elsewhere classified: Secondary | ICD-10-CM

## 2023-10-16 DIAGNOSIS — M542 Cervicalgia: Secondary | ICD-10-CM | POA: Diagnosis not present

## 2023-10-16 NOTE — Therapy (Signed)
 OUTPATIENT PHYSICAL THERAPY VESTIBULAR TREATMENT NOTE     Patient Name: Diane Shea MRN: [id number] DOB:1950-11-13, 73 y.o., female Today's Date: 10/16/2023  END OF SESSION:  PT End of Session - 10/16/23 0808     Visit Number 9    Number of Visits 29    Date for PT Re-Evaluation 12/25/23    PT Start Time 0810    Equipment Utilized During Treatment Gait belt    Activity Tolerance Patient tolerated treatment well;No increased pain    Behavior During Therapy [facility] for tasks assessed/performed                 Past Medical History:  Diagnosis Date   Breast cancer (HCC) 2003   left breast cancer, radiation   Cancer (HCC) 2003   Breast   Encounter for screening colonoscopy 04/15/2013   Graves disease    Hypertension 2006   Migraines    Personal history of radiation therapy    Past Surgical History:  Procedure Laterality Date   ABDOMINAL HYSTERECTOMY  2004   BREAST EXCISIONAL BIOPSY Left 2003   sentinel node   BREAST LUMPECTOMY Left 2003   BREAST SURGERY Left 2003   mammosite placement   COLONOSCOPY  2004   CYST EXCISION  2009   vocal cord   FOOT SURGERY     HIP PINNING,CANNULATED Right 04/16/2015   Procedure: CANNULATED HIP PINNING;  Surgeon: Arlyne Lame, MD;  Location: [facility] ORS;  Service: Orthopedics;  Laterality: Right;   REPLACEMENT TOTAL KNEE Left 2013   Patient Active Problem List   Diagnosis Date Noted   Dizziness 08/17/2023   Fall 08/17/2023   SAH (subarachnoid hemorrhage) (HCC) 08/17/2023   Pulmonary nodule 08/17/2023   Acquired hypothyroidism 08/17/2023   Headache 08/17/2023   Hip fracture, right (HCC) 04/15/2015   Hip fracture (HCC) 04/15/2015   Encounter for screening colonoscopy 04/15/2013    PCP: Jimmy Moulding, MD  REFERRING PROVIDER: Jimmy Moulding, MD   REFERRING DIAG: R42 (ICD-10-CM) - Dizziness and giddiness   THERAPY DIAG:  No diagnosis found.  ONSET DATE: 08/17/2023  Rationale for Evaluation and  Treatment: Rehabilitation  SUBJECTIVE:   SUBJECTIVE STATEMENT:  Pt symptoms have been good. She did not have to take medication, but did have some pre headache sx that didn't turn into a headache.  Pt accompanied by: self  PERTINENT HISTORY: Pt is a pleasant 73 yo female referred to PT for dizziness. Pt admitted to [facility] 12/12-12/13/24 due to dizziness following mechanical fall on stairs with LOC. Pt found to have trace R frontal subarachnoid hemorrhage. Pt has also since been diagnosed with a concussion. While in hospital pt exhibited positive Dix-Hallpike and improved following Epley maneuvers with recommendation for outpatient PT follow-up. Pt reports maneuvers helped and that L & R sides were both treated. After this she reports she was d/c home. She reports no other vertigo episodes like what she was experiencing before, but reports difficulty turning her head occ and that this makes her woozy/unsteady, this is worse with fatigue. She has been sleeping in a recliner/not yet slept flat. Pt seeing family tomorrow for the holidays, lives with spouse, would like to improve her symptoms. No other falls since the 1 that brought her to the hospital. Pt did report neck soreness following hospital stay, but OK now. PMH significant for migraines, acquired hypothyroidism, breast CA (L breast, radiation, 2003), L breast surgery 2003, TKA L 2013, Graves disease, HTN, see chart for full details  Now being treated to reduce migraine symptoms.  PAIN:  Are you having pain? No  PRECAUTIONS: Fall  RED FLAGS: None   WEIGHT BEARING RESTRICTIONS: No  FALLS: Has patient fallen in last 6 months? Yes. Number of falls 1  LIVING ENVIRONMENT: Lives with: lives with their spouse   PLOF: Independent  PATIENT GOALS: improve her symptoms  OBJECTIVE:  Note: Objective measures were completed at Evaluation unless otherwise noted.  DIAGNOSTIC FINDINGS:    CT Cervical Spine 08/17/2023: "   IMPRESSION: 1. Trace right frontal subarachnoid hemorrhage. No mass effect or midline shift. 2. No acute/traumatic cervical spine pathology. 3. An 11 mm left apical subpleural nodule. Dedicated chest CT is recommended for further evaluation.   These results were called by telephone at the time of interpretation on 08/17/2023 at 8:25 pm to DR [facility], who verbally acknowledged these results."     Electronically Signed   By: Angus Bark M.D.   On: 08/17/2023 20:27"  CT HEAD 08/17/23:  "IMPRESSION: 1. Trace right frontal subarachnoid hemorrhage. No mass effect or midline shift. 2. No acute/traumatic cervical spine pathology. 3. An 11 mm left apical subpleural nodule. Dedicated chest CT is recommended for further evaluation.   These results were called by telephone at the time of interpretation on 08/17/2023 at 8:25 pm to DR [facility], who verbally acknowledged these results.     Electronically Signed   By: Angus Bark M.D.   On: 08/17/2023 20:27"  MR BRAIN 06/14/2023: "IMPRESSION: No acute or reversible finding. No cause of headache identified. Minimal small vessel change of the cerebral hemispheric white matter, less than often seen at this age.     Electronically Signed   By: Bettylou Brunner M.D."   On: 06/14/2023 15:04  COGNITION: Overall cognitive status: Within functional limits for tasks assessed   POSTURE:  Very slight crouched posture, possibly due to unsteadiness  Cervical ROM:   Quick cervical AROM screen indicates pt has sufficient pain-free AROM (rotation, ext/flex) needed for vestibular positional testing  STRENGTH: not assessed today  BED MOBILITY:  Currently impaired due to sx (pt not sleeping in bed, she is currently sleeping in recliner)  TRANSFERS: Assistive device utilized: Single point cane  Sit to stand: SBA and CGA Stand to sit: Complete Independence Chair to chair: SBA    GAIT: Gait pattern:  currently guarded due to feelings of  unsteadiness, pt using SPC, scanning impaired per pt hx due to unsteadiness with head turns Distance walked: clinic distances Assistive device utilized: Single point cane Level of assistance: Modified independence and CGA   FUNCTIONAL TESTS:  DGI and MCTSIB to be performed future visit as needed  PATIENT SURVEYS:  FOTO 33 (goal 53)  VESTIBULAR ASSESSMENT:  GENERAL OBSERVATION: cautious gait with SPC due to unsteadiness    SYMPTOM BEHAVIOR:  Subjective history: vertigo s/p fall with concussion, improved after maneuvers provided in hospital to R and L sides (see above)  Non-Vestibular symptoms: headaches and nausea/vomiting  Type of dizziness:  previously vertigo, now more woozy/unsteady, particularly with head turns  Frequency: with particular movements  Relieving factors:  pt not currently lying flat/sleeping in recliner to avoid sx  Progression of symptoms: better since Epley maneuvers provided in hospital  OCULOMOTOR EXAM:  Ocular Alignment: normal  Ocular ROM: No Limitations  Spontaneous Nystagmus: absent  Gaze-Induced Nystagmus: right beating with right gaze  Smooth Pursuits:  slightly saccadic, possibly WNL for age  Saccades: hypometric/undershoots to L  Convergence/Divergence: WNL   VESTIBULAR - OCULAR REFLEX:  deferred ocular reflex testing below due to time  Slow VOR:   VOR Cancellation:   Head-Impulse Test:   Dynamic Visual Acuity:    POSITIONAL TESTING:  Right Dix-Hallpike: POSITIVE, <30 sec duration, delayed onset, R torsional and upbeating nystagmus  Repeated following Epley (see below): still positive, but pt reports dizziness less intense, nystagmus is also less intense in appearance                                                                                                                             TREATMENT DATE:   NMR: postural retraining and modulation of symptoms UT traps stretch x60 sec each side  LTRs x 2 mim  Open book 10x each side -  feeling of tightness to L down LUE  Supine chin tuck 2x10 with 3 sec hold/rep - feels mostly on R  Cat-cow 1x10  Table top - thread the needle 4x each side - challenging Cervical circles cw/cc 10x for each Cervical flexion with rhomboid stretch x60 sec    TA: Nustep cardio performance/endurance training - lvl 1 - 4 x 6 min - cuing for warm-up/cool-down Fast gait with vertical, horizontal, diagonal head turn x 4 minutes for sx response and for cardio performance, observed good balance but slight feelings of unsteadiness    PATIENT EDUCATION: Education details: Pt educated throughout session about proper posture and technique with exercises. Improved exercise technique, movement at target joints, use of target muscles after min to mod verbal, visual, tactile cues.  Person educated: Patient Education method: Explanation Education comprehension: verbalized understanding  HOME EXERCISE PROGRAM:  Access Code: [email or web address] URL: [email or web address] Date: 10/02/2023 Prepared by: Aminta Kales   Exercises - Seated Upper Trapezius Stretch  - 1 x daily - 5-7 x weekly - 2 sets - 1 reps - 30 sec/side hold - Standing March with Counter Support  - 1 x daily - 5-7 x weekly - 3 sets - 10 reps - Walking  - 1 x daily - 5-7 x weekly - 2 sets - 1 reps - 5 minutes hold      1/6:   Access Code: [email or web address] URL: [email or web address] Date: 09/11/2023 Prepared by: Aminta Kales  Exercises - Standing Single Leg Stance with Counter Support  - 2 x daily - 5-7 x weekly - 2 sets - 1 reps - 30 seconds/leg hold  - Standing Balance in Corner with Eyes Closed  - 2 x daily - 5-7 x weekly - 2 sets - 1 reps - 30 seconds hold - further instruction to use sturdy chair in front for additoinal support   GOALS: Goals reviewed with patient? Yes  SHORT TERM GOALS: Target date: 11/13/2023    Patient will be independent in home exercise program to improve gait, mobility and balance for  better functional independence with ADLs. Baseline: to be initiated as needed next 1-2 visits; 10/02/2023: updated Goal status: ONGOING   LONG TERM GOALS: Target date: 12/25/2023    Patient  will increase FOTO score to equal to or greater than 53  to demonstrate statistically significant improvement in mobility and quality of life.  Baseline: 33; 1/9: 51 Goal status: DISCONTINUED  2.  Patient will reduce dizziness handicap inventory score to <50, for less dizziness with ADLs and increased safety with home and work tasks.  Baseline: 1/2: 72 Goal status: INITIAL  3.  Patient will increase ABC scale score >80% to demonstrate better functional mobility and better confidence with ADLs.  Baseline:  Goal status: INITIAL  4.  Patient will increase dynamic gait index score to >19/24 as to demonstrate reduced fall risk and improved dynamic gait balance for better safety with community/home ambulation.   Baseline: scored 22/24 on 09/11/2023 Goal status: MET on 09/11/2023  5. Pt will report 2 weeks without a migraine to improve QOL and ease with ADLs.    Baseline: up to 4/week  Goal status: NEW  6.Patient will increase six minute walk test distance to >1200 ft for progression to community ambulator and improve gait ability  Baseline: 1056 ft  Goal status: NEW    ASSESSMENT:  CLINICAL IMPRESSION: Session limited secondary to pt late arrival. However, pt highly motivated to participate and pt tolerates all interventions well without pain or other symptoms. She is still slightly fatigued with short duration cardio activities. The pt will benefit from further skilled PT to improve impairments in order to increase QOL and improve ease/safety with ADLs.  OBJECTIVE IMPAIRMENTS: Abnormal gait, decreased balance, difficulty walking, dizziness, improper body mechanics, and postural dysfunction.   ACTIVITY LIMITATIONS: bending, stairs, bed mobility, and locomotion level  PARTICIPATION LIMITATIONS:  meal prep, cleaning, laundry, driving, shopping, community activity, and yard work  PERSONAL FACTORS: Age, Sex, and 3+ comorbidities: PMH significant for migraines, acquired hypothyroidism, breast CA (L breast, radiation, 2003), L breast surgery 2003, TKA L 2013, Graves disease, HTN, see chart for full details  are also affecting patient's functional outcome.   REHAB POTENTIAL: Good  CLINICAL DECISION MAKING: Evolving/moderate complexity  EVALUATION COMPLEXITY: Moderate   PLAN:  PT FREQUENCY: 1-2x/week  PT DURATION: 12 weeks  PLANNED INTERVENTIONS: 97164- PT Re-evaluation, 97110-Therapeutic exercises, 97530- Therapeutic activity, 97112- Neuromuscular re-education, 97535- Self Care, 78469- Manual therapy, [phone]- Gait training, [phone]- Orthotic Fit/training, [phone]- Canalith repositioning, Patient/Family education, Balance training, Stair training, Joint mobilization, Spinal mobilization, Vestibular training, Cryotherapy, and Moist heat    PLAN FOR NEXT SESSION: reassess DH as needed, manual therapy, conditioning   Samie Crews, PT 10/16/2023, 8:12 AM

## 2023-10-18 ENCOUNTER — Ambulatory Visit: Payer: Medicare PPO

## 2023-10-18 DIAGNOSIS — M542 Cervicalgia: Secondary | ICD-10-CM

## 2023-10-18 DIAGNOSIS — R262 Difficulty in walking, not elsewhere classified: Secondary | ICD-10-CM

## 2023-10-18 NOTE — Therapy (Addendum)
OUTPATIENT PHYSICAL THERAPY VESTIBULAR TREATMENT NOTE/Physical Therapy Progress Note   Dates of reporting period  08/29/2023   to   10/18/2023      Patient Name: Ventura Leggitt MRN: [id number] DOB:Oct 29, 1950, 73 y.o., female Today's Date: 10/18/2023  END OF SESSION:  PT End of Session - 10/18/23 0805     Visit Number 10    Number of Visits 29    Date for PT Re-Evaluation 12/25/23    PT Start Time 0805    PT Stop Time 0845    PT Time Calculation (min) 40 min    Equipment Utilized During Treatment Gait belt    Activity Tolerance Patient tolerated treatment well;No increased pain    Behavior During Therapy [facility] for tasks assessed/performed                 Past Medical History:  Diagnosis Date   Breast cancer (HCC) 2003   left breast cancer, radiation   Cancer (HCC) 2003   Breast   Encounter for screening colonoscopy 04/15/2013   Graves disease    Hypertension 2006   Migraines    Personal history of radiation therapy    Past Surgical History:  Procedure Laterality Date   ABDOMINAL HYSTERECTOMY  2004   BREAST EXCISIONAL BIOPSY Left 2003   sentinel node   BREAST LUMPECTOMY Left 2003   BREAST SURGERY Left 2003   mammosite placement   COLONOSCOPY  2004   CYST EXCISION  2009   vocal cord   FOOT SURGERY     HIP PINNING,CANNULATED Right 04/16/2015   Procedure: CANNULATED HIP PINNING;  Surgeon: Donato Heinz, MD;  Location: [facility] ORS;  Service: Orthopedics;  Laterality: Right;   REPLACEMENT TOTAL KNEE Left 2013   Patient Active Problem List   Diagnosis Date Noted   Dizziness 08/17/2023   Fall 08/17/2023   SAH (subarachnoid hemorrhage) (HCC) 08/17/2023   Pulmonary nodule 08/17/2023   Acquired hypothyroidism 08/17/2023   Headache 08/17/2023   Hip fracture, right (HCC) 04/15/2015   Hip fracture (HCC) 04/15/2015   Encounter for screening colonoscopy 04/15/2013    PCP: Lauro Regulus, MD  REFERRING PROVIDER: Lauro Regulus, MD   REFERRING  DIAG: R42 (ICD-10-CM) - Dizziness and giddiness   THERAPY DIAG:  Cervicalgia  Difficulty in walking, not elsewhere classified  ONSET DATE: 08/17/2023  Rationale for Evaluation and Treatment: Rehabilitation  SUBJECTIVE:   SUBJECTIVE STATEMENT:  Pt woke up with a headache this morning, feels into back of head/sinus/neck region. She thinks it has to do with weather.   Pt accompanied by: self  PERTINENT HISTORY: Pt is a pleasant 73 yo female referred to PT for dizziness. Pt admitted to [facility] 12/12-12/13/24 due to dizziness following mechanical fall on stairs with LOC. Pt found to have trace R frontal subarachnoid hemorrhage. Pt has also since been diagnosed with a concussion. While in hospital pt exhibited positive Dix-Hallpike and improved following Epley maneuvers with recommendation for outpatient PT follow-up. Pt reports maneuvers helped and that L & R sides were both treated. After this she reports she was d/c home. She reports no other vertigo episodes like what she was experiencing before, but reports difficulty turning her head occ and that this makes her woozy/unsteady, this is worse with fatigue. She has been sleeping in a recliner/not yet slept flat. Pt seeing family tomorrow for the holidays, lives with spouse, would like to improve her symptoms. No other falls since the 1 that brought her to the hospital. Pt did  report neck soreness following hospital stay, but OK now. PMH significant for migraines, acquired hypothyroidism, breast CA (L breast, radiation, 2003), L breast surgery 2003, TKA L 2013, Graves disease, HTN, see chart for full details  Now being treated to reduce migraine symptoms.  PAIN:  Are you having pain? No  PRECAUTIONS: Fall  RED FLAGS: None   WEIGHT BEARING RESTRICTIONS: No  FALLS: Has patient fallen in last 6 months? Yes. Number of falls 1  LIVING ENVIRONMENT: Lives with: lives with their spouse   PLOF: Independent  PATIENT GOALS: improve her  symptoms  OBJECTIVE:  Note: Objective measures were completed at Evaluation unless otherwise noted.  DIAGNOSTIC FINDINGS:    CT Cervical Spine 08/17/2023: "  IMPRESSION: 1. Trace right frontal subarachnoid hemorrhage. No mass effect or midline shift. 2. No acute/traumatic cervical spine pathology. 3. An 11 mm left apical subpleural nodule. Dedicated chest CT is recommended for further evaluation.   These results were called by telephone at the time of interpretation on 08/17/2023 at 8:25 pm to DR [facility] who verbally acknowledged these results."     Electronically Signed   By: Elgie Collard M.D.   On: 08/17/2023 20:27"  CT HEAD 08/17/23:  "IMPRESSION: 1. Trace right frontal subarachnoid hemorrhage. No mass effect or midline shift. 2. No acute/traumatic cervical spine pathology. 3. An 11 mm left apical subpleural nodule. Dedicated chest CT is recommended for further evaluation.   These results were called by telephone at the time of interpretation on 08/17/2023 at 8:25 pm to DR [facility], who verbally acknowledged these results.     Electronically Signed   By: Elgie Collard M.D.   On: 08/17/2023 20:27"  MR BRAIN 06/14/2023: "IMPRESSION: No acute or reversible finding. No cause of headache identified. Minimal small vessel change of the cerebral hemispheric white matter, less than often seen at this age.     Electronically Signed   By: Paulina Fusi M.D."   On: 06/14/2023 15:04  COGNITION: Overall cognitive status: Within functional limits for tasks assessed   POSTURE:  Very slight crouched posture, possibly due to unsteadiness  Cervical ROM:   Quick cervical AROM screen indicates pt has sufficient pain-free AROM (rotation, ext/flex) needed for vestibular positional testing  STRENGTH: not assessed today  BED MOBILITY:  Currently impaired due to sx (pt not sleeping in bed, she is currently sleeping in recliner)  TRANSFERS: Assistive device utilized:  Single point cane  Sit to stand: SBA and CGA Stand to sit: Complete Independence Chair to chair: SBA    GAIT: Gait pattern:  currently guarded due to feelings of unsteadiness, pt using SPC, scanning impaired per pt hx due to unsteadiness with head turns Distance walked: clinic distances Assistive device utilized: Single point cane Level of assistance: Modified independence and CGA   FUNCTIONAL TESTS:  DGI and MCTSIB to be performed future visit as needed  PATIENT SURVEYS:  FOTO 33 (goal 53)  VESTIBULAR ASSESSMENT:  GENERAL OBSERVATION: cautious gait with SPC due to unsteadiness    SYMPTOM BEHAVIOR:  Subjective history: vertigo s/p fall with concussion, improved after maneuvers provided in hospital to R and L sides (see above)  Non-Vestibular symptoms: headaches and nausea/vomiting  Type of dizziness:  previously vertigo, now more woozy/unsteady, particularly with head turns  Frequency: with particular movements  Relieving factors:  pt not currently lying flat/sleeping in recliner to avoid sx  Progression of symptoms: better since Epley maneuvers provided in hospital  OCULOMOTOR EXAM:  Ocular Alignment: normal  Ocular ROM: No  Limitations  Spontaneous Nystagmus: absent  Gaze-Induced Nystagmus: right beating with right gaze  Smooth Pursuits:  slightly saccadic, possibly WNL for age  Saccades: hypometric/undershoots to L  Convergence/Divergence: WNL   VESTIBULAR - OCULAR REFLEX: deferred ocular reflex testing below due to time  Slow VOR:   VOR Cancellation:   Head-Impulse Test:   Dynamic Visual Acuity:    POSITIONAL TESTING:  Right Dix-Hallpike: POSITIVE, <30 sec duration, delayed onset, R torsional and upbeating nystagmus  Repeated following Epley (see below): still positive, but pt reports dizziness less intense, nystagmus is also less intense in appearance                                                                                                                              TREATMENT DATE:     Physical Performance: - see assessment and goal section below for full details Instructions and results reviewed with pt  TA: Standing hip abd 2x10 each LE  Cervical rotation stretch 2x30 sec each way  Seated UT stretch 2x30 sec each side   Wall push up 2x10  Open book 3x10 each side - feels more of a stretch on R side  LTRs 2x20  Supine chin tuck 2x10x 2 sec hold/rep   Self-care/Home management: HEP updated & reviewed with pt  Access Code: [email or web address] URL: [email or web address] Date: 10/18/2023 Prepared by: Temple Pacini  Exercises - Seated Upper Trapezius Stretch  - 1 x daily - 5-7 x weekly - 2 sets - 1 reps - 30 sec/side hold - Standing March with Counter Support  - 1 x daily - 5-7 x weekly - 3 sets - 10 reps - Walking  - 1 x daily - 5-7 x weekly - 2 sets - 1 reps - 5 minutes hold - Seated Cervical Rotation AROM  - 1 x daily - 3-5 x weekly - 2 sets - 1 reps - 30 seconds/side  hold - Standing Hip Abduction with Counter Support  - 1 x daily - 3-5 x weekly - 2 sets - 10 reps - Sidelying Open Book Thoracic Lumbar Rotation and Extension  - 1 x daily - 7 x weekly - 3 sets - 10 reps - Wall Push Up  - 1 x daily - 3-5 x weekly - 2 sets - 10 reps   PT provides education on fitness classes for continued improvement in mobility and strengthening, such as a yoga class.    Pt reports improved HA symptoms following intervnetions   PATIENT EDUCATION: Education details: Pt educated throughout session about proper posture and technique with exercises. Improved exercise technique, movement at target joints, use of target muscles after min to mod verbal, visual, tactile cues.  Person educated: Patient Education method: Explanation Education comprehension: verbalized understanding  HOME EXERCISE PROGRAM:  Access Code: [email or web address] URL: [email or web address] Date: 10/02/2023 Prepared by: Temple Pacini   Exercises -  Seated Upper Trapezius Stretch  - 1 x daily - 5-7 x weekly - 2  sets - 1 reps - 30 sec/side hold - Standing March with Counter Support  - 1 x daily - 5-7 x weekly - 3 sets - 10 reps - Walking  - 1 x daily - 5-7 x weekly - 2 sets - 1 reps - 5 minutes hold      1/6:   Access Code: [email or web address] URL: [email or web address] Date: 09/11/2023 Prepared by: Temple Pacini  Exercises - Standing Single Leg Stance with Counter Support  - 2 x daily - 5-7 x weekly - 2 sets - 1 reps - 30 seconds/leg hold  - Standing Balance in Corner with Eyes Closed  - 2 x daily - 5-7 x weekly - 2 sets - 1 reps - 30 seconds hold - further instruction to use sturdy chair in front for additoinal support   GOALS: Goals reviewed with patient? Yes  SHORT TERM GOALS: Target date: 11/13/2023    Patient will be independent in home exercise program to improve gait, mobility and balance for better functional independence with ADLs. Baseline: to be initiated as needed next 1-2 visits; 10/02/2023: updated Goal status: ONGOING   LONG TERM GOALS: Target date: 12/25/2023    Patient will increase FOTO score to equal to or greater than 53  to demonstrate statistically significant improvement in mobility and quality of life.  Baseline: 33; 1/9: 51 Goal status: DISCONTINUED  2.  Patient will reduce dizziness handicap inventory score to <50, for less dizziness with ADLs and increased safety with home and work tasks.  Baseline: 1/2: 72 Goal status: INITIAL  3.  Patient will increase ABC scale score >80% to demonstrate better functional mobility and better confidence with ADLs.  Baseline:  Goal status: INITIAL  4.  Patient will increase dynamic gait index score to >19/24 as to demonstrate reduced fall risk and improved dynamic gait balance for better safety with community/home ambulation.   Baseline: scored 22/24 on 09/11/2023 Goal status: MET on 09/11/2023  5. Pt will report 2 weeks without a migraine to improve QOL  and ease with ADLs.    Baseline: up to 4/week; 10/18/23; today is closest she has been to having full-blown migraine HA, but otherwise not present  Goal status: PARTIALLY MET  6.Patient will increase six minute walk test distance to >1200 ft for progression to community ambulator and improve gait ability  Baseline: 1056 ft; 10/18/23:1468 ft  Goal status: MET    ASSESSMENT:  CLINICAL IMPRESSION: Goal reassessment completed due to today potentially being pt's last visit if further visits are not approved by insurance.  She has now met goal, indicating increased functional capacity and gait ability, and has partially met migraine symptom goal. HEP updated and reviewed. Pt reported headache improvement following interventions.The pt will benefit from further skilled PT to improve impairments in order to increase QOL and improve ease/safety with ADLs.  OBJECTIVE IMPAIRMENTS: Abnormal gait, decreased balance, difficulty walking, dizziness, improper body mechanics, and postural dysfunction.   ACTIVITY LIMITATIONS: bending, stairs, bed mobility, and locomotion level  PARTICIPATION LIMITATIONS: meal prep, cleaning, laundry, driving, shopping, community activity, and yard work  PERSONAL FACTORS: Age, Sex, and 3+ comorbidities: PMH significant for migraines, acquired hypothyroidism, breast CA (L breast, radiation, 2003), L breast surgery 2003, TKA L 2013, Graves disease, HTN, see chart for full details  are also affecting patient's functional outcome.   REHAB POTENTIAL: Good  CLINICAL DECISION MAKING: Evolving/moderate complexity  EVALUATION COMPLEXITY: Moderate   PLAN:  PT FREQUENCY: 1-2x/week  PT DURATION: 12 weeks  PLANNED INTERVENTIONS: 54098-  PT Re-evaluation, 97110-Therapeutic exercises, 97530- Therapeutic activity, [id number]- Neuromuscular re-education, [phone]- Self Care, 60454- Manual therapy, [phone]- Gait training, [phone]- Orthotic Fit/training, [phone]- Canalith repositioning,  Patient/Family education, Balance training, Stair training, Joint mobilization, Spinal mobilization, Vestibular training, Cryotherapy, and Moist heat    PLAN FOR NEXT SESSION: reassess DH as needed, manual therapy, conditioning   Baird Kay, PT 10/18/2023, 8:51 AM

## 2023-10-23 ENCOUNTER — Ambulatory Visit: Payer: Medicare PPO

## 2023-10-25 ENCOUNTER — Ambulatory Visit: Payer: Medicare PPO

## 2023-10-30 ENCOUNTER — Ambulatory Visit: Payer: Medicare PPO

## 2023-11-01 ENCOUNTER — Ambulatory Visit: Payer: Medicare PPO

## 2023-11-06 ENCOUNTER — Ambulatory Visit: Payer: Medicare PPO

## 2023-11-08 ENCOUNTER — Ambulatory Visit: Payer: Medicare PPO

## 2023-11-13 ENCOUNTER — Ambulatory Visit: Payer: Medicare PPO

## 2023-11-15 ENCOUNTER — Ambulatory Visit: Payer: Medicare PPO

## 2023-11-21 ENCOUNTER — Ambulatory Visit: Payer: Medicare PPO

## 2023-11-23 ENCOUNTER — Ambulatory Visit: Payer: Medicare PPO

## 2023-11-24 ENCOUNTER — Other Ambulatory Visit: Payer: Self-pay | Admitting: Internal Medicine

## 2023-11-24 DIAGNOSIS — Z1231 Encounter for screening mammogram for malignant neoplasm of breast: Secondary | ICD-10-CM

## 2023-11-28 ENCOUNTER — Ambulatory Visit: Payer: Medicare PPO

## 2023-11-30 ENCOUNTER — Ambulatory Visit: Payer: Medicare PPO

## 2023-12-01 ENCOUNTER — Inpatient Hospital Stay
Admission: RE | Admit: 2023-12-01 | Discharge: 2023-12-01 | Disposition: A | Discharge status: Home / Self Care | Payer: Self-pay | Source: Ambulatory Visit | Attending: Internal Medicine | Admitting: Internal Medicine

## 2023-12-01 ENCOUNTER — Other Ambulatory Visit: Payer: Self-pay | Admitting: *Deleted

## 2023-12-01 DIAGNOSIS — Z1231 Encounter for screening mammogram for malignant neoplasm of breast: Secondary | ICD-10-CM

## 2023-12-05 ENCOUNTER — Other Ambulatory Visit: Payer: Self-pay | Admitting: Internal Medicine

## 2023-12-05 ENCOUNTER — Ambulatory Visit: Payer: Medicare PPO

## 2023-12-05 DIAGNOSIS — N649 Disorder of breast, unspecified: Secondary | ICD-10-CM

## 2023-12-07 ENCOUNTER — Ambulatory Visit: Payer: Medicare PPO

## 2023-12-12 ENCOUNTER — Ambulatory Visit: Payer: Medicare PPO

## 2023-12-14 ENCOUNTER — Ambulatory Visit: Payer: Medicare PPO

## 2023-12-19 ENCOUNTER — Ambulatory Visit: Payer: Medicare PPO

## 2023-12-21 ENCOUNTER — Ambulatory Visit: Payer: Medicare PPO

## 2023-12-25 ENCOUNTER — Ambulatory Visit
Admission: RE | Admit: 2023-12-25 | Discharge: 2023-12-25 | Disposition: A | Discharge status: Home / Self Care | Source: Ambulatory Visit | Attending: Internal Medicine | Admitting: Internal Medicine

## 2023-12-25 DIAGNOSIS — N649 Disorder of breast, unspecified: Secondary | ICD-10-CM | POA: Insufficient documentation

## 2024-04-18 ENCOUNTER — Other Ambulatory Visit: Payer: Self-pay | Admitting: Internal Medicine

## 2024-04-18 DIAGNOSIS — Z1231 Encounter for screening mammogram for malignant neoplasm of breast: Secondary | ICD-10-CM

## 2024-04-18 DIAGNOSIS — R911 Solitary pulmonary nodule: Secondary | ICD-10-CM

## 2024-04-18 DIAGNOSIS — R9389 Abnormal findings on diagnostic imaging of other specified body structures: Secondary | ICD-10-CM

## 2024-05-02 ENCOUNTER — Other Ambulatory Visit: Payer: Self-pay

## 2024-05-02 ENCOUNTER — Encounter: Payer: Self-pay | Admitting: Gastroenterology

## 2024-05-10 ENCOUNTER — Ambulatory Visit
Admission: RE | Admit: 2024-05-10 | Discharge: 2024-05-10 | Disposition: A | Discharge status: Home / Self Care | Attending: Gastroenterology | Admitting: Gastroenterology

## 2024-05-10 ENCOUNTER — Ambulatory Visit

## 2024-05-10 ENCOUNTER — Encounter
Admission: RE | Disposition: A | Discharge status: Home / Self Care | Payer: Self-pay | Source: Home / Self Care | Attending: Gastroenterology

## 2024-05-10 ENCOUNTER — Encounter: Payer: Self-pay | Admitting: Gastroenterology

## 2024-05-10 ENCOUNTER — Other Ambulatory Visit: Payer: Self-pay

## 2024-05-10 DIAGNOSIS — Z79899 Other long term (current) drug therapy: Secondary | ICD-10-CM | POA: Diagnosis not present

## 2024-05-10 DIAGNOSIS — K640 First degree hemorrhoids: Secondary | ICD-10-CM | POA: Insufficient documentation

## 2024-05-10 DIAGNOSIS — Z79890 Hormone replacement therapy (postmenopausal): Secondary | ICD-10-CM | POA: Insufficient documentation

## 2024-05-10 DIAGNOSIS — I10 Essential (primary) hypertension: Secondary | ICD-10-CM | POA: Insufficient documentation

## 2024-05-10 DIAGNOSIS — E039 Hypothyroidism, unspecified: Secondary | ICD-10-CM | POA: Diagnosis not present

## 2024-05-10 DIAGNOSIS — R519 Headache, unspecified: Secondary | ICD-10-CM | POA: Insufficient documentation

## 2024-05-10 DIAGNOSIS — D123 Benign neoplasm of transverse colon: Secondary | ICD-10-CM | POA: Diagnosis not present

## 2024-05-10 DIAGNOSIS — Z923 Personal history of irradiation: Secondary | ICD-10-CM | POA: Diagnosis not present

## 2024-05-10 DIAGNOSIS — Z1211 Encounter for screening for malignant neoplasm of colon: Secondary | ICD-10-CM | POA: Diagnosis present

## 2024-05-10 DIAGNOSIS — K5730 Diverticulosis of large intestine without perforation or abscess without bleeding: Secondary | ICD-10-CM | POA: Diagnosis not present

## 2024-05-10 DIAGNOSIS — Z853 Personal history of malignant neoplasm of breast: Secondary | ICD-10-CM | POA: Insufficient documentation

## 2024-05-10 HISTORY — PX: COLONOSCOPY: SHX5424

## 2024-05-10 HISTORY — PX: POLYPECTOMY: SHX149

## 2024-05-10 SURGERY — COLONOSCOPY
Anesthesia: General

## 2024-05-10 MED ORDER — PROPOFOL 500 MG/50ML IV EMUL
INTRAVENOUS | Status: DC | PRN
  Administered 2024-05-10: 125 ug/kg/min via INTRAVENOUS

## 2024-05-10 MED ORDER — SODIUM CHLORIDE 0.9 % IV SOLN: INTRAVENOUS | Status: DC

## 2024-05-10 MED ORDER — PROPOFOL 10 MG/ML IV BOLUS
INTRAVENOUS | Status: DC | PRN
  Administered 2024-05-10: 100 mg via INTRAVENOUS

## 2024-05-10 MED ORDER — LIDOCAINE HCL (PF) 2 % IJ SOLN
INTRAMUSCULAR | Status: AC
  Filled 2024-05-10: qty 5

## 2024-05-10 NOTE — Anesthesia Postprocedure Evaluation (Signed)
 Anesthesia Post Note  Patient: Diane Shea  Procedure(s) Performed: COLONOSCOPY POLYPECTOMY, INTESTINE  Patient location during evaluation: Endoscopy Anesthesia Type: General Level of consciousness: awake and alert Pain management: pain level controlled Vital Signs Assessment: post-procedure vital signs reviewed and stable Respiratory status: spontaneous breathing, nonlabored ventilation and respiratory function stable Cardiovascular status: blood pressure returned to baseline and stable Postop Assessment: no apparent nausea or vomiting Anesthetic complications: no   No notable events documented.   Last Vitals:  Vitals:   05/10/24 1107 05/10/24 1111  BP: (!) 185/85 (!) 177/88  Pulse:    Resp: 12 11  Temp:    SpO2:      Last Pain:  Vitals:   05/10/24 1055  TempSrc:   PainSc: 0-No pain                 Fairy POUR Keghan Mcfarren

## 2024-05-10 NOTE — Transfer of Care (Signed)
 Immediate Anesthesia Transfer of Care Note  Patient: Diane Shea  Procedure(s) Performed: COLONOSCOPY POLYPECTOMY, INTESTINE  Patient Location: Endoscopy Unit  Anesthesia Type:MAC  Level of Consciousness: awake  Airway & Oxygen Therapy: Patient Spontanous Breathing and Patient connected to nasal cannula oxygen  Post-op Assessment: Report given to RN and Post -op Vital signs reviewed and stable  Post vital signs: Reviewed and stable  Last Vitals:  Vitals Value Taken Time  BP 132/79 05/10/24 10:32  Temp 35.6 C 05/10/24 10:32  Pulse 68 05/10/24 10:34  Resp 14 05/10/24 10:34  SpO2 100 % 05/10/24 10:34  Vitals shown include unfiled device data.  Last Pain:  Vitals:   05/10/24 1032  TempSrc: Temporal  PainSc: 0-No pain         Complications: No notable events documented.

## 2024-05-10 NOTE — Anesthesia Preprocedure Evaluation (Signed)
 Anesthesia Evaluation  Patient identified by MRN, date of birth, ID band Patient awake    Reviewed: Allergy & Precautions, NPO status , Patient's Chart, lab work & pertinent test results  History of Anesthesia Complications (+) PONV and history of anesthetic complications  Airway Mallampati: III  TM Distance: <3 FB Neck ROM: full    Dental  (+) Chipped   Pulmonary neg pulmonary ROS, neg shortness of breath   Pulmonary exam normal        Cardiovascular Exercise Tolerance: Good hypertension, (-) angina Normal cardiovascular exam     Neuro/Psych  Headaches  negative psych ROS   GI/Hepatic negative GI ROS, Neg liver ROS,neg GERD  ,,  Endo/Other  Hypothyroidism    Renal/GU negative Renal ROS  negative genitourinary   Musculoskeletal   Abdominal   Peds  Hematology negative hematology ROS (+)   Anesthesia Other Findings Past Medical History: 2003: Breast cancer (HCC)     Comment:  left breast cancer, radiation 2003: Cancer (HCC)     Comment:  Breast 04/15/2013: Encounter for screening colonoscopy No date: Graves disease 2006: Hypertension No date: Migraines No date: Personal history of radiation therapy  Past Surgical History: 2004: ABDOMINAL HYSTERECTOMY 2003: BREAST EXCISIONAL BIOPSY; Left     Comment:  sentinel node 2003: BREAST LUMPECTOMY; Left 2003: BREAST SURGERY; Left     Comment:  mammosite placement 2004: COLONOSCOPY 2009: CYST EXCISION     Comment:  vocal cord No date: FOOT SURGERY 04/16/2015: HIP PINNING,CANNULATED; Right     Comment:  Procedure: CANNULATED HIP PINNING;  Surgeon: Lynwood SHAUNNA Hue, MD;  Location: [facility] ORS;  Service: Orthopedics;                Laterality: Right; 2013: REPLACEMENT TOTAL KNEE; Left  BMI    Body Mass Index: 32.16 kg/m      Reproductive/Obstetrics negative OB ROS                              Anesthesia Physical Anesthesia  Plan  ASA: 2  Anesthesia Plan: General   Post-op Pain Management:    Induction: Intravenous  PONV Risk Score and Plan: Propofol  infusion and TIVA  Airway Management Planned: Natural Airway and Nasal Cannula  Additional Equipment:   Intra-op Plan:   Post-operative Plan:   Informed Consent: I have reviewed the patients History and Physical, chart, labs and discussed the procedure including the risks, benefits and alternatives for the proposed anesthesia with the patient or authorized representative who has indicated his/her understanding and acceptance.     Dental Advisory Given  Plan Discussed with: Anesthesiologist, CRNA and Surgeon  Anesthesia Plan Comments: (Patient consented for risks of anesthesia including but not limited to:  - adverse reactions to medications - risk of airway placement if required - damage to eyes, teeth, lips or other oral mucosa - nerve damage due to positioning  - sore throat or hoarseness - Damage to heart, brain, nerves, lungs, other parts of body or loss of life  Patient voiced understanding and assent.)        Anesthesia Quick Evaluation

## 2024-05-10 NOTE — Interval H&P Note (Signed)
 History and Physical Interval Note: Preprocedure H&P from 05/10/24  was reviewed and there was no interval change after seeing and examining the patient.  Written consent was obtained from the patient after discussion of risks, benefits, and alternatives. Patient has consented to proceed with Colonoscopy with possible intervention   05/10/2024 10:06 AM  Diane Shea  has presented today for surgery, with the diagnosis of Colon cancer screening (Z12.11).  The various methods of treatment have been discussed with the patient and family. After consideration of risks, benefits and other options for treatment, the patient has consented to  Procedure(s): COLONOSCOPY (N/A) as a surgical intervention.  The patient's history has been reviewed, patient examined, no change in status, stable for surgery.  I have reviewed the patient's chart and labs.  Questions were answered to the patient's satisfaction.     Elspeth Ozell Jungling

## 2024-05-10 NOTE — OR Nursing (Signed)
 Pt BP elevated after initial post op readings x2, anesthesia notified, Dr. Stevan ok with d/c'ing pt with current BP reading. Pt was slightly elevated in pre-op. Pt told to follow up w/ PCP and check BP at home regularly.

## 2024-05-10 NOTE — Op Note (Signed)
 [facility] Gastroenterology Patient Name: Diane Shea Procedure Date: 05/10/2024 9:57 AM MRN: [id number] Account #: [id number] Date of Birth: [birth year] Admit Type: Outpatient Age: 73 Room: [facility] ENDO ROOM 1 Gender: Female Note Status: Finalized Instrument Name: Colon Scope [id number] Procedure:             Colonoscopy Indications:           Screening for colorectal malignant neoplasm Providers:             Elspeth Ozell Jungling DO, DO Medicines:             Monitored Anesthesia Care Complications:         No immediate complications. Estimated blood loss:                         Minimal. Procedure:             Pre-Anesthesia Assessment:                        - Prior to the procedure, a History and Physical was                         performed, and patient medications and allergies were                         reviewed. The patient is competent. The risks and                         benefits of the procedure and the sedation options and                         risks were discussed with the patient. All questions                         were answered and informed consent was obtained.                         Patient identification and proposed procedure were                         verified by the physician, the nurse, the anesthetist                         and the technician in the endoscopy suite. Mental                         Status Examination: alert and oriented. Airway                         Examination: normal oropharyngeal airway and neck                         mobility. Respiratory Examination: clear to                         auscultation. CV Examination: RRR, no murmurs, no S3                         or S4. Prophylactic Antibiotics: The patient does  not                         require prophylactic antibiotics. Prior                         Anticoagulants: The patient has taken no anticoagulant                         or antiplatelet agents. ASA Grade  Assessment: II - A                         patient with mild systemic disease. After reviewing                         the risks and benefits, the patient was deemed in                         satisfactory condition to undergo the procedure. The                         anesthesia plan was to use monitored anesthesia care                         (MAC). Immediately prior to administration of                         medications, the patient was re-assessed for adequacy                         to receive sedatives. The heart rate, respiratory                         rate, oxygen saturations, blood pressure, adequacy of                         pulmonary ventilation, and response to care were                         monitored throughout the procedure. The physical                         status of the patient was re-assessed after the                         procedure.                        After obtaining informed consent, the colonoscope was                         passed under direct vision. Throughout the procedure,                         the patient's blood pressure, pulse, and oxygen                         saturations were monitored continuously. The  Colonoscope was introduced through the anus and                         advanced to the the terminal ileum, with                         identification of the appendiceal orifice and IC                         valve. The colonoscopy was performed without                         difficulty. The patient tolerated the procedure well.                         The quality of the bowel preparation was evaluated                         using the BBPS [facility] Bowel Preparation Scale) with                         scores of: Right Colon = 3, Transverse Colon = 3 and                         Left Colon = 3 (entire mucosa seen well with no                         residual staining, small fragments of stool or opaque                          liquid). The total BBPS score equals 9. The terminal                         ileum, ileocecal valve, appendiceal orifice, and                         rectum were photographed. Findings:      The perianal and digital rectal examinations were normal. Pertinent       negatives include normal sphincter tone.      The terminal ileum appeared normal. Estimated blood loss: none.      Retroflexion in the right colon was performed.      A 1 to 2 mm polyp was found in the transverse colon. The polyp was       sessile. The polyp was removed with a jumbo cold forceps. Resection and       retrieval were complete. Estimated blood loss was minimal.      Multiple small-mouthed diverticula were found in the sigmoid colon.       Estimated blood loss: none.      Non-bleeding internal hemorrhoids were found during retroflexion. The       hemorrhoids were Grade I (internal hemorrhoids that do not prolapse).       Estimated blood loss: none.      The exam was otherwise without abnormality on direct and retroflexion       views. Impression:            - The examined portion of the ileum was normal.                        -  One 1 to 2 mm polyp in the transverse colon, removed                         with a jumbo cold forceps. Resected and retrieved.                        - Diverticulosis in the sigmoid colon.                        - Non-bleeding internal hemorrhoids.                        - The examination was otherwise normal on direct and                         retroflexion views. Recommendation:        - Patient has a contact number available for                         emergencies. The signs and symptoms of potential                         delayed complications were discussed with the patient.                         Return to normal activities tomorrow. Written                         discharge instructions were provided to the patient.                        - Discharge patient to home.                         - Resume previous diet.                        - Continue present medications.                        - Await pathology results.                        - Likely no repeat surveillance colonoscopy given age                         and only 1 polyp life time. No family history of colon                         polyps or colon cancer.                        - Return to referring physician as previously                         scheduled.                        - The findings and recommendations were discussed with  the patient. Procedure Code(s):     --- Professional ---                        [phone], Colonoscopy, flexible; with biopsy, single or                         multiple Diagnosis Code(s):     --- Professional ---                        Z12.11, Encounter for screening for malignant neoplasm                         of colon                        K64.0, First degree hemorrhoids                        D12.3, Benign neoplasm of transverse colon (hepatic                         flexure or splenic flexure)                        K57.30, Diverticulosis of large intestine without                         perforation or abscess without bleeding CPT copyright 2022 American Medical Association. All rights reserved. The codes documented in this report are preliminary and upon coder review may  be revised to meet current compliance requirements. Attending Participation:      I personally performed the entire procedure. Elspeth Jungling, DO Elspeth Ozell Jungling DO, DO 05/10/2024 10:34:12 AM This report has been signed electronically. Number of Addenda: 0 Note Initiated On: 05/10/2024 9:57 AM Scope Withdrawal Time: 0 hours 11 minutes 55 seconds  Total Procedure Duration: 0 hours 16 minutes 39 seconds  Estimated Blood Loss:  Estimated blood loss was minimal.      [facility]

## 2024-05-10 NOTE — H&P (Signed)
 Pre-Procedure H&P   Patient ID: Diane Shea is a 73 y.o. female.  Gastroenterology Provider: Elspeth Ozell Jungling, DO  Referring Provider: Romero [place], PA PCP: Lenon Layman ORN, MD  Date: 05/10/2024  HPI [name] is a 73 y.o. female who presents today for Colonoscopy for colorectal cancer screening .  Bowels daily without melena hematochezia diarrhea or constipation No family history of colon cancer or colon polyps  Daily bowel movement without melena or hematochezia  Last underwent colonoscopy with Dr. Dessa in 2014  Status post hysterectomy   Past Medical History:  Diagnosis Date   Breast cancer [facility]) 2003   left breast cancer, radiation   Cancer (HCC) 2003   Breast   Encounter for screening colonoscopy 04/15/2013   Graves disease    Hypertension 2006   Migraines    Personal history of radiation therapy     Past Surgical History:  Procedure Laterality Date   ABDOMINAL HYSTERECTOMY  2004   BREAST EXCISIONAL BIOPSY Left 2003   sentinel node   BREAST LUMPECTOMY Left 2003   BREAST SURGERY Left 2003   mammosite placement   COLONOSCOPY  2004   CYST EXCISION  2009   vocal cord   FOOT SURGERY     HIP PINNING,CANNULATED Right 04/16/2015   Procedure: CANNULATED HIP PINNING;  Surgeon: Lynwood SHAUNNA Hue, MD;  Location: [facility] ORS;  Service: Orthopedics;  Laterality: Right;   REPLACEMENT TOTAL KNEE Left 2013    Family History No h/o GI disease or malignancy  Review of Systems  Constitutional:  Negative for activity change, appetite change, chills, diaphoresis, fatigue, fever and unexpected weight change.  HENT:  Negative for trouble swallowing and voice change.   Respiratory:  Negative for shortness of breath and wheezing.   Cardiovascular:  Negative for chest pain, palpitations and leg swelling.  Gastrointestinal:  Negative for abdominal distention, abdominal pain, anal bleeding, blood in stool, constipation, diarrhea, nausea, rectal  pain and vomiting.  Musculoskeletal:  Negative for arthralgias and myalgias.  Skin:  Negative for color change and pallor.  Neurological:  Negative for dizziness, syncope and weakness.  Psychiatric/Behavioral:  Negative for confusion.   All other systems reviewed and are negative.    Medications No current facility-administered medications on file prior to encounter.   Current Outpatient Medications on File Prior to Encounter  Medication Sig Dispense Refill   ALPHA LIPOIC ACID PO Take 1 capsule by mouth at bedtime.      calcium  carbonate (OS-CAL) 600 MG TABS tablet Take 600 mg by mouth 2 (two) times daily.      cholecalciferol  (VITAMIN D ) 1000 UNITS tablet Take 1,000 Units by mouth daily.     DULoxetine  (CYMBALTA ) 60 MG capsule Take 60 mg by mouth daily.     levothyroxine  (SYNTHROID ) 112 MCG tablet Take 112 mcg by mouth daily before breakfast.     magnesium  oxide (MAG-OX) 400 (240 Mg) MG tablet Take 400 mg by mouth every evening.     Multiple Vitamins-Minerals (MULTIVITAMIN WITH MINERALS) tablet Take 1 tablet by mouth daily.     omega-3 acid ethyl esters (LOVAZA) 1 g capsule Take 1 g by mouth daily.     Turmeric (QC TUMERIC COMPLEX PO) Take by mouth.     SUMAtriptan  (IMITREX ) 100 MG tablet Take 1 tablet by mouth every 2 (two) hours as needed for migraine.      topiramate  (TOPAMAX ) 50 MG tablet Take 50 mg by mouth daily. (Patient not taking: Reported on 05/10/2024)  Pertinent medications related to GI and procedure were reviewed by me with the patient prior to the procedure   Current Facility-Administered Medications:    0.9 %  sodium chloride  infusion, , Intravenous, Continuous, Onita Elspeth Sharper, DO, Last Rate: 20 mL/hr at 05/10/24 0929, New Bag at 05/10/24 0929  sodium chloride  20 mL/hr at 05/10/24 9070       Allergies  Allergen Reactions   Sulfa Antibiotics    Ibandronate Other (See Comments)    Arthralgia's   Allergies were reviewed by me prior to the  procedure  Objective   Body mass index is 32.16 kg/m. Vitals:   05/02/24 1212 05/10/24 0923  BP:  (!) 161/91  Pulse:  84  Resp:  20  Temp:  (!) 97.1 F (36.2 C)  TempSrc:  Temporal  SpO2:  95%  Weight: 77.1 kg 77.2 kg  Height: 5' 1 (1.549 m) 5' 1 (1.549 m)     Physical Exam Vitals and nursing note reviewed.  Constitutional:      General: She is not in acute distress.    Appearance: Normal appearance. She is not ill-appearing, toxic-appearing or diaphoretic.  HENT:     Head: Normocephalic and atraumatic.     Nose: Nose normal.     Mouth/Throat:     Mouth: Mucous membranes are moist.     Pharynx: Oropharynx is clear.  Eyes:     General: No scleral icterus.    Extraocular Movements: Extraocular movements intact.  Cardiovascular:     Rate and Rhythm: Normal rate and regular rhythm.     Heart sounds: Normal heart sounds. No murmur heard.    No friction rub. No gallop.  Pulmonary:     Effort: Pulmonary effort is normal. No respiratory distress.     Breath sounds: Normal breath sounds. No wheezing, rhonchi or rales.  Abdominal:     General: Bowel sounds are normal. There is no distension.     Palpations: Abdomen is soft.     Tenderness: There is no abdominal tenderness. There is no guarding or rebound.  Musculoskeletal:     Cervical back: Neck supple.     Right lower leg: No edema.     Left lower leg: No edema.  Skin:    General: Skin is warm and dry.     Coloration: Skin is not jaundiced or pale.  Neurological:     General: No focal deficit present.     Mental Status: She is alert and oriented to person, place, and time. Mental status is at baseline.  Psychiatric:        Mood and Affect: Mood normal.        Behavior: Behavior normal.        Thought Content: Thought content normal.        Judgment: Judgment normal.      Assessment:  [name] is a 73 y.o. female  who presents today for Colonoscopy for colorectal cancer screening .  Plan:   Colonoscopy with possible intervention today  Colonoscopy with possible biopsy, control of bleeding, polypectomy, and interventions as necessary has been discussed with the patient/patient representative. Informed consent was obtained from the patient/patient representative after explaining the indication, nature, and risks of the procedure including but not limited to death, bleeding, perforation, missed neoplasm/lesions, cardiorespiratory compromise, and reaction to medications. Opportunity for questions was given and appropriate answers were provided. Patient/patient representative has verbalized understanding is amenable to undergoing the procedure.   Elspeth Sharper Onita, DO  [facility] Gastroenterology  Portions of the record may have been created with voice recognition software. Occasional wrong-word or 'sound-a-like' substitutions may have occurred due to the inherent limitations of voice recognition software.  Read the chart carefully and recognize, using context, where substitutions may have occurred.

## 2024-05-14 LAB — SURGICAL PATHOLOGY

## 2024-05-24 ENCOUNTER — Ambulatory Visit
Admission: RE | Admit: 2024-05-24 | Discharge: 2024-05-24 | Disposition: A | Discharge status: Home / Self Care | Source: Ambulatory Visit | Attending: Internal Medicine | Admitting: Internal Medicine

## 2024-05-24 DIAGNOSIS — Z1231 Encounter for screening mammogram for malignant neoplasm of breast: Secondary | ICD-10-CM | POA: Insufficient documentation

## 2024-05-24 DIAGNOSIS — R9389 Abnormal findings on diagnostic imaging of other specified body structures: Secondary | ICD-10-CM | POA: Diagnosis present

## 2024-05-24 DIAGNOSIS — R911 Solitary pulmonary nodule: Secondary | ICD-10-CM | POA: Diagnosis present
